# Patient Record
Sex: Male | Born: 1945 | Race: White | Hispanic: No | Marital: Single
Health system: Midwestern US, Academic
[De-identification: ages and names within clinical notes are randomized; demographics above are authoritative.]

## PROBLEM LIST (undated history)

## (undated) LAB — GXT EXERCISE (STRESS ONLY)
1 Minute Post HR: 141 {beats}/min
1.7 / 10% HR (Additional): 126 {beats}/min
1.7 / 10% HR: 115 {beats}/min
1.7 / 10% Time (Additional): 3
1.7 / 10% Time: 1
2 Minute Post HR: 123 {beats}/min
2.5 / 12% HR (Additional): 146 {beats}/min
2.5 / 12% HR: 133 {beats}/min
2.5 / 12% Time (Additional): 6
2.5 / 12% Time: 4
3 Minute Post HR: 112 {beats}/min
4 Minute Post HR: 108 {beats}/min
5 Minute Post HR: 111 {beats}/min
6 Minute Post HR: 109 {beats}/min
Angina Score During Excercise: 0
CALC Duke Treadmill Score: -4
Exercise Duration (min): 6 minutes
Exercise Time (Seconds): 22 Seconds
Immediate Stop HR: 151 {beats}/min
Max Grade: 14
Max Speed: 3.4
Pre-Exercise Physical Ausc: NORMAL
ST Seg Deviation: 2 mm
Standing HR: 105 {beats}/min
Supine HR: 93
Target Heart Rate: 130

---

## 2014-10-08 ENCOUNTER — Encounter

## 2014-10-15 ENCOUNTER — Inpatient Hospital Stay: Admit: 2014-10-15 | Payer: PRIVATE HEALTH INSURANCE

## 2014-10-15 DIAGNOSIS — I2510 Atherosclerotic heart disease of native coronary artery without angina pectoris: Secondary | ICD-10-CM

## 2014-10-15 NOTE — Unmapped (Signed)
Dr Peter Minium reviewed the EKG prior to starting stress test.  Dr Peter Minium reviewed EKGs prior to patient leaving. No chest pain but did have ST depression.  Erica at Dr Dellie Burns office notified of abnormal results. Explained that we can get the patient in to see Dr Richardine Service on Thursday and Dr Dellie Burns office agreed.  Spoke with patient and scheduled him to see Dr Richardine Service at 2:15pm on Thursday.

## 2014-10-17 ENCOUNTER — Ambulatory Visit: Admit: 2014-10-17 | Payer: PRIVATE HEALTH INSURANCE

## 2014-10-17 DIAGNOSIS — I208 Other forms of angina pectoris: Secondary | ICD-10-CM

## 2014-10-17 NOTE — Unmapped (Signed)
Schedule echocardiogram    Start aspirin 325mg  daily    Atorvastatin 20mg  each evening    Start Metoprolol 25mg  twice a day    Schedule heart cath     Outpatient Instructions for:  Left Heart Catheterization    1.  Your heart catheterization has been set up for:  TBD    2.  Arrival Time:  TBD  Diagnostic Center:  Same Day Surgery on 2nd floor of [facility]    The hospital will need to prepare you before the procedure.  This may take an hour or two before the procedure will start.  An IV line will be started.  You may need lab work drawn and an EKG done the day of the procedure.  Wear loose comfortable clothes.    Someone will need to drive you home from the hospital    3.  Diet:  Do not eat or drink after midnight    4.  You may take your morning medication with a sip of water.   If you are diabetic, take 1/2 of your regular morning insulin dosage. and DO NOT take the following medications 48 hours after the procedure:  Gluophage, Metformin, Glucovance, Avandamet, Combination    5.  Please notify the nurse if you are on COUMADIN / WARFARIN / PRADAXA / EFFIENT.  We may need to adjust your dose.  DO NOT STOP Plavix or Aspirin.    6.  Please notify the nurse if you have an allergy to contrast dye, IV dye, iodine or an allergic reaction to shellfish.    7.  Prior to the heart catheterization, the following tests will need to be obtained:  PT / INR    If the above test(s) are not completed with 2 weeks prior, the procedure maybe cancelled.        If you have any questions, please call your RN Nurse Coordinator.

## 2014-10-17 NOTE — Unmapped (Signed)
Subjective:      Patient ID: Kirk Hernandez is a 68 y.o. male     Chief Complaint: Abnml stress test    History of Present Illness:    10/17/2014 Initial Visit:    Kirk Hernandez is a 68 y.o. White or Caucasian male presenting to the clinic today for an abnml stress test. He has a hx of tinnitus and tried to get a hearing aid but was unable to. 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 Over 3.5 years he was told he no longer was considered diabetic. He denies kidney problems. He had a balloon angioplasty in 1999 in [place], [place] at the [facility]. He had an asymptomatic MI, other than pain on the right side of his face. He had similar sxs 2 weeks ago and thought it was an abscess tooth. He has an rx for nitro pills but has not had to use them. He denies taking ASA or cholesterol medicine. He has a hx of CAD. He has a family hx of heart dz. NKDA. He quit smoking in 1999. He drinks 10-15 beers qd. He has not exercised regularly in 2 years. He used to walk 5 miles every morning. When taking the stress test, he c/o SOB and leg heaviness.  He denies problems sleeping and lives by himself. He denies liver problems or blood clotting issues. Today, pt denies chest pain, palpitations, lightheadedness/dizziness, leg edema, or presyncope/syncope. Pt has no other complaints at this time.    The following portions of the patient's history were reviewed and updated as appropriate: allergies, current medications, past family history, past medical history, past social history, past surgical history and problem list.      Histories:    He has no past medical history on file.  He  has no past surgical history on file.  He family history is not on file.  He      ROS:      Reviewed and scanned into EMR/EPIC.     Allergies:      He has No Known Allergies.    Medications:      No outpatient prescriptions prior to visit.     No facility-administered medications prior to visit.       Objective:      BP 132/74  mmHg   Pulse 105   Ht 5' 10 (1.778 m)   Wt 235 lb (106.595 kg)   BMI 33.72 kg/m2   SpO2 94%  Wt Readings from Last 3 Encounters:   10/17/14 235 lb (106.595 kg)       Exceptional findings:         Physical Exam   Constitutional: No distress. He appears healthy.   HENT:  ?? Nose: Nose normal.   ?? Mouth/Throat: Dentition is normal.   ?? Eyes: Conjunctivae are normal. Pupils are equal, round, and reactive to light.   Neck: Normal range of motion. Neck supple. No JVD present.   Cardiovascular: Normal rate, regular rhythm and normal pulses.  Exam reveals no gallop and no friction rub.  No murmurs.  No carotid bruit  Pulmonary/Chest: Effort normal and breath sounds normal.   Abdominal: Soft. He  exhibits no distension.   Musculoskeletal: Normal range of motion. He  exhibits no edema.   Neurological: He  is alert and oriented to person, place, and time.   Skin: Skin is warm and dry.     Lab Review:     No results found for:  HGB, HCT, PLT, WBC, MCV, MCH  No results found for: NA, K, CL, CO2, BUN, CREATININE, GLUCOSE, PHOS, ALBUMIN, CALCIUM  No results found for: AST, ALT, ALBUMIN, ALKPHOS  No results found for: CHOLTOT, HDL, LDL, TRIG  No results found for: TSH  No results found for: HGBA1C, GLUCOSE, CREATININE    Prior Diagnostic Testing:    [facility] Stress: 10/15/14  ECG IMPRESSION   Abnormal exercise test   INDICATION:   FH, CAD  Exercise results:  Exercise time-minutes: 6 minutes Exercise time-sec: 22 seconds   Max speed: 3.4 mph Max grade: 14 %   Max HR: 151 Max BP: 150/82 mmHg   Reason for stopping: leg fatigue and shortness of breath Symptoms:   shortness of breath   ECG DATA  ??REST STRESS   Rhythm Sinus rhythm Sinus tachycardia   Conduction Normal Normal   Arrhythmias None VPCs ??At peak and recovery   Repolarization Normal and ST depression ST depression   ST Segment Change ??Strongly positive   ST Segment Location ??Inferolateral ??  ST Segment Configuration ??Horizontal   ST Segment Depression ??2   Duke Score: ?? -4 ?? (5 or  higher is Low Risk, -10 to +5 is Intermediate   Risk, and below -10 is High Risk)      Assessment:     Problem list:  1. Angina and abnormal stress test  1. CAD   2. History of PTCA  3. Borderline DM  4. Family Hx of CAD  5. Chronic EtOH use    Plan:       1. Echo  2. Add ASA 325 mg qd  3. Atrovastatin 20 mg qd  4. Metoprolol 25 mg po BID  5. Cardiac cath for definitive diagnosis    Follow up in 2 months.  Instructed patient to call office for worsening shortness of breath, chest pain, syncope, or any other concerning changes.     It is a pleasure to assist in the care of [facility].  Please call with any questions. All questions and concerns were addressed to the patient/family. Alternatives to my treatment were discussed. The note was completed using EMR. Every effort was made to ensure accuracy; however, inadvertent computerize transcription errors may be present.     This note was scribed in the presence of Collier Flowers, MD by Marcelyn Bruins Benschoten    The scribe???s documentation has been prepared under my direction and personally reviewed by me in its entirety. I confirm that the note above accurately reflects all work, treatment, procedures, and medical decision making performed by me.    Collier Flowers MD  [facility] Cardiology  Interventional Cardiology

## 2014-10-22 NOTE — Unmapped (Signed)
Patient f/u on meds ordered at [facility] on 12/31. Patient wants to know if he still needs to take them. They are still pending. Patient also f/u on scheduling procedure.

## 2014-10-23 MED ORDER — atenolol (TENORMIN) 25 MG tablet
25 | ORAL_TABLET | Freq: Every day | ORAL | 0.00 refills | 30.00000 days | Status: AC
Start: 2014-10-23 — End: 2014-10-28

## 2014-10-23 MED ORDER — aspirin 325 MG tablet
325 | ORAL_TABLET | Freq: Every day | ORAL | 1.00 refills | 30.00000 days | Status: AC
Start: 2014-10-23 — End: 2015-02-27

## 2014-10-23 MED ORDER — metoprolol tartrate (LOPRESSOR) 25 MG tablet
25 | ORAL_TABLET | Freq: Two times a day (BID) | ORAL | Status: AC
Start: 2014-10-23 — End: 2014-11-14

## 2014-10-23 NOTE — Unmapped (Signed)
Vmm left for patient to advise that prescriptions have been e scribed to [facility].  Procedure is scheduled for 1/11 at [facility] with a 6am arrival.  Contact number left for patient to return call to confirm receipt.

## 2014-10-28 ENCOUNTER — Ambulatory Visit: Admit: 2014-10-28 | Payer: PRIVATE HEALTH INSURANCE

## 2014-10-28 LAB — BASIC METABOLIC PANEL
Anion Gap: 12 mmol/L (ref 3–16)
BUN: 11 mg/dL (ref 7–25)
CO2: 23 mmol/L (ref 21–33)
Calcium: 9.1 mg/dL (ref 8.6–10.3)
Chloride: 105 mmol/L (ref 98–110)
Creatinine: 1.06 mg/dL (ref 0.60–1.30)
GFR MDRD Af Amer: 84 See note.
GFR MDRD Non Af Amer: 70 See note.
Glucose: 247 mg/dL — ABNORMAL HIGH (ref 70–100)
Osmolality, Calculated: 298 mosm/kg (ref 278–305)
Potassium: 3.5 mmol/L (ref 3.5–5.3)
Sodium: 140 mmol/L (ref 133–146)

## 2014-10-28 LAB — POC GLU MONITORING DEVICE: POC Glucose Monitoring Device: 197 mg/dL (ref 70–100)

## 2014-10-28 LAB — PROTIME-INR
INR: 1 (ref 0.9–1.1)
Protime: 12.5 s (ref 11.6–14.4)

## 2014-10-28 LAB — CBC
Hematocrit: 48 % (ref 38.5–50.0)
Hemoglobin: 16.3 g/dL (ref 13.2–17.1)
MCH: 30 pg (ref 27.0–33.0)
MCHC: 33.9 g/dL (ref 32.0–36.0)
MCV: 88.6 fL (ref 80.0–100.0)
MPV: 7.4 fL — ABNORMAL LOW (ref 7.5–11.5)
Platelets: 233 10E3/uL (ref 140–400)
RBC: 5.42 10E6/uL (ref 4.20–5.80)
RDW: 13.2 % (ref 11.0–15.0)
WBC: 9 10E3/uL (ref 3.8–10.8)

## 2014-10-28 LAB — HEMOGLOBIN A1C: Hemoglobin A1C: 7.6 % (ref 4.8–6.4)

## 2014-10-28 MED ORDER — fentaNYL (SUBLIMAZE) 50 mcg/mL injection
50 | INTRAMUSCULAR | Status: AC
Start: 2014-10-28 — End: 2014-10-28

## 2014-10-28 MED ORDER — sodium chloride 0.9 % infusion
INTRAVENOUS | Status: AC
Start: 2014-10-28 — End: 2014-10-28

## 2014-10-28 MED ORDER — heparin (porcine) 5,000 unit/mL injection
5000 | INTRAMUSCULAR | Status: AC
Start: 2014-10-28 — End: 2014-10-28

## 2014-10-28 MED ORDER — fentaNYL (SUBLIMAZE) injection
50 | INTRAMUSCULAR | Status: AC | PRN
Start: 2014-10-28 — End: 2014-10-28
  Administered 2014-10-28 (×2): 25 via INTRAVENOUS

## 2014-10-28 MED ORDER — OMNIPAQUE (iohexol) 350 mg iodine/mL
350 | INTRAVENOUS | Status: AC | PRN
Start: 2014-10-28 — End: 2014-10-28
  Administered 2014-10-28: 17:00:00 80

## 2014-10-28 MED ORDER — oxyCODONE-acetaminophen (PERCOCET) 5-325 mg per tablet 1 tablet
5-325 | ORAL | Status: AC | PRN
Start: 2014-10-28 — End: 2014-10-28

## 2014-10-28 MED ORDER — acetaminophen (TYLENOL) tablet 650 mg
325 | ORAL | Status: AC | PRN
Start: 2014-10-28 — End: 2014-10-28

## 2014-10-28 MED ORDER — verapamil (ISOPTIN) 2.5 mg/mL injection
2.5 | INTRAVENOUS | Status: AC
Start: 2014-10-28 — End: 2014-10-28

## 2014-10-28 MED ORDER — midazolam (PF) (VERSED) injection 1 mg
1 | Freq: Once | INTRAMUSCULAR | Status: AC
Start: 2014-10-28 — End: 2014-10-28

## 2014-10-28 MED ORDER — atorvastatin (LIPITOR) 40 MG tablet
40 | ORAL_TABLET | Freq: Every day | ORAL | Status: AC
Start: 2014-10-28 — End: ?

## 2014-10-28 MED ORDER — aspirin tablet 325 mg
325 | Freq: Once | ORAL | Status: AC
Start: 2014-10-28 — End: 2014-10-28

## 2014-10-28 MED ORDER — fentaNYL (SUBLIMAZE) injection 50 mcg
50 | Freq: Once | INTRAMUSCULAR | Status: AC | PRN
Start: 2014-10-28 — End: 2014-10-28

## 2014-10-28 MED ORDER — midazolam (PF) (VERSED) injection 1 mg
1 | Freq: Once | INTRAMUSCULAR | Status: AC | PRN
Start: 2014-10-28 — End: 2014-10-28

## 2014-10-28 MED ORDER — sodium chloride 0.9 % infusion 1,000 mL
INTRAVENOUS | Status: AC
Start: 2014-10-28 — End: 2014-10-28
  Administered 2014-10-28: 12:00:00 1000 mL via INTRAVENOUS

## 2014-10-28 MED ORDER — midazolam (PF) (VERSED) 1 mg/mL injection
1 | INTRAMUSCULAR | Status: AC
Start: 2014-10-28 — End: 2014-10-28

## 2014-10-28 MED ORDER — fentaNYL (SUBLIMAZE) injection 25 mcg
50 | Freq: Once | INTRAMUSCULAR | Status: AC
Start: 2014-10-28 — End: 2014-10-28
  Administered 2014-10-28: 16:00:00 25 ug via INTRAVENOUS

## 2014-10-28 MED ORDER — midazolam (PF) (VERSED) injection
1 | INTRAMUSCULAR | Status: AC | PRN
Start: 2014-10-28 — End: 2014-10-28
  Administered 2014-10-28: 16:00:00 1 via INTRAVENOUS
  Administered 2014-10-28: 16:00:00 0.5 via INTRAVENOUS

## 2014-10-28 MED FILL — VERAPAMIL 2.5 MG/ML INTRAVENOUS SOLUTION: 2.5 2.5 mg/mL | INTRAVENOUS | Qty: 2

## 2014-10-28 MED FILL — FENTANYL (PF) 50 MCG/ML INJECTION SOLUTION: 50 50 mcg/mL | INTRAMUSCULAR | Qty: 2

## 2014-10-28 MED FILL — HEPARIN (PORCINE) 5,000 UNIT/ML INJECTION SOLUTION: 5000 5,000 unit/mL | INTRAMUSCULAR | Qty: 2

## 2014-10-28 MED FILL — MIDAZOLAM (PF) 1 MG/ML INJECTION SOLUTION: 1 1 mg/mL | INTRAMUSCULAR | Qty: 5

## 2014-10-28 MED FILL — ASPIRIN 325 MG TABLET: 325 325 MG | ORAL | Qty: 1

## 2014-10-28 NOTE — Unmapped (Signed)
Echo completed.  R groin is soft, no hematoma, no oozing, dsx intact.  VS stable. Pt dressed and IV d/c'd.  Pt discharged from [facility] via wheelchair escort. All belongings with patient.

## 2014-10-28 NOTE — Unmapped (Signed)
MD paged about pt's FSBS. Pt's glucose from labs this am-247 and post cath 197. Pt states he has not been taking any diabetic meds x 2 years d/t his MD told him he was not diabetic anymore.  Also, pt's cardiology meds are supposed to be changed per Cath lab RN Maddie.  Waiting for MD to call back.

## 2014-10-28 NOTE — Unmapped (Signed)
ATTENDING: Collier Flowers, MD  FELLOW: Gerre Scull, MD    PROCEDURES PERFORMED:  1. Left heart catheterization  2. Selective coronary angiography  3. Left ventriculogram  4. Deployment of angioseal     INDICATIONS:   Positive stress test     DETAILS OF PROCEDURE:    Procedure, it risks, benefits and alternatives were explained to the patient, all questions were answered and informed consent was obtained.    * The patient was brought to [facility] and prepped and draped in the usual sterile fashion for intended access to the  right femoral artery.     * Lidocaine 2% was injected subcutaneously to achieve local anesthesia.  * The  right was accessed via modified Seldinger technique without difficulty, and a 5 French  arterial sheath was advanced over a wire into the artery, the wire removed,  and the sheath aspirated and flushed.      * The sideport of the arterial sheath was hooked directly to the manifold for injection of the right femoral artery.  This showed the access site to be above the level of the femoral arterial bifurcation with no evidence of vascular complication.    * A JL 4 catheter was advanced over a wire, visualized under fluoroscopy, and used to selectively engage the left coronary ostia.  Images of the left coronary tree were obtained.      * The JL4 catheter was then removed and exchanged over a wire for a JR 4 catheter. This was used to selectively engage the right coronary ostia.  Images of the  right coronary tree were obtained, and the JR 4 catheter was then removed in its entirety.  The sheath was again aspirated and flushed without difficulty.      * A pigtail catheter was advanced over a wire, visualized under fluoroscopy, and used to cross the aortic valve and enter the left ventricular cavity.      * Left ventricular end diastolic pressure was measured.      * A left ventriculogram was performed with a power injector.     * The pigtail catheter  was pulled back across the aortic valve, demonstrating no gradient.  The pigtail catheter was then removed in its entirety.      * The patient was stable throughout the procedure and at the end of procedure, no complication noted.       PROCEDURAL FINDINGS:  1. Left main coronary artery was heavily calcified and diffusely diseased up to 40%. It gave of the left anterior descending artery, ramus intermidius and the left circumflex artery.      2. The left anterior descending had a normal anatomic course.  It gave off diagonals. Proximal to mid LAD was diffusely diseased and heavily calcified, long sequential 60-70% stenosis noted in proximal LAD. First D1 branch was medium size vessel and mid D1 had 60% stenosis.     3. The left circumflex artery was small, non-dominant vessel and it gives off obtuse marginals and it was diffusely diseased.     4. RI was diffusely diseased with stenosis up to 40%.     5. The right coronary artery was a large, dominant vessel and gave rise to posterior descending artery and posterolateral branches distally. RCA wad diffusely diseased up to 40% and and heavily calcified.    HEMODYNAMICS:  LVEDP:  LVEF: 50-55%  No gradient across the aortic valve      CONCLUSION:  1. Moderate  Diffuse calcified CAD in LAD/ diagonal     PLAN:  1. Add high intensity statin, continue ASA for secondary prevention.   2. Echocardiogram prior to discharge.  3. Follow up with PCP ASAP for better DM management.   4. Follow up with Dr. Richardine Service as outpatient in next 2-3 weeks, will reassess his angina after medication optimization. May consider a stress MPI to assess medical therapy is working or he may need CABG.    Dr. Richardine Service was present during all critical and key portions of the procedure and was available to furnish services during the entire procedure.    Gerre Scull, MD  Cardiology fellow     I have seen, examined and discussed with fellow. Agree with above plan.    Collier Flowers MD  [facility]  Cardiology  Interventional Cardiology    10/28/2014  1:19 PM    CC:  Joneen Caraway, MD  Collier Flowers, MD  @REFER @

## 2014-10-28 NOTE — Unmapped (Signed)
Pt brought from cath lab post LHC procedure. Bedside report received from [facility]. R femoral artery used for access.  R femoral artery angiosealed in cath lab at 1145. R is soft, no hematoma, no oozing noted. Tegaderm dsx intact. Pt's bedrest x 2 hours, pt aware of their restrictions to their RLE and pt aware of his bedrest restrictions.  Pt's FSBS 197- pt given a snack and lunch will be ordered. Will continue to monitor pt closely.

## 2014-10-28 NOTE — Unmapped (Addendum)
Pt off of bedrest. Pt up to restroom, pt ambulated well. R groin is soft, no hematoma, no oozing, dsx intact.  VS stable. MD ordered an A1C lab- added on to pt's blood drawn this am.    Pt given discharge instructions, angioseal booklet, pt educated on the change in his medications and pt given instructions to follow up with his PCP ASAP about his blood glucose and A1C results.  Pt gave verbal understanding to d/c instructions and education. Pt's friend is driving him home.   MD ordered for pt to get an Echo before he is discharged. Echo called, they will be to [facility] ASAP to perform Echo.

## 2014-10-28 NOTE — Unmapped (Signed)
Echo at bedside

## 2014-10-28 NOTE — Unmapped (Signed)
Care for the Catheter Insertion Site      Refer to this sheet over the next several weeks. These instructions provide you with information on caring for yourself after your procedure. Your caregiver may also give you additional specific instructions. Your treatment has been planned according to current evidence-based medical practices, but problems sometimes occur. Call your care provider if you have any concerns or questions after your procedure.     HOME CARE INSTRUCTIONS  ??? You may shower 24 hours after the procedure. Remove the bandage (dressing) and gently wash the site with plain soap and water.   ??? Keep the area clean and dry. Do not apply powder or lotion to the site.  ??? Do not sit in a bathtub, swimming pool, or whirlpool for 7 days.   For the first 7 days do not participate in strenuous activity, climb stairs slowly, and do not lift over 10 pounds. Do not strain during bowel movements.Inspect the site at least twice daily. Watch groin for bleeding, swelling, redness, and drainage. Signs of infection include redness or streaking, swelling, and discharge from the site.Do not drive home if you are discharged the same day of the procedure. Have someone else drive you. You may drive 24 hours after the procedure unless otherwise instructed by your caregiver.   ??? A responsible adult should be with you for the first 24 hours after you arrive home.      IF Bleeding Occurs:  ??? Small amount of bleeding or oozing at groin site:  Sit or lie down and apply firm pressure for 10 minutes.  When bleeding stops, sit or lie still and keep leg straight for 2 hours.  Call your doctor for further instructions. Cardiology office # [phone]  ??? If there is heavy bleeding, spurting or bleeding does not stop after 10 minutes:  Call 911, lie down and hold firm pressure to the site until help arrives  DO NOT DRIVE or get into a private car.    When to call your doctor:  ??? Chest pain or shortness of breath.  ??? Temperature higher  than 101??  ??? Pain in thigh or leg.  ??? Change in color, movement or sensation of affected area.  ??? Change in groin (bleeding, swelling, drainage, pain)    Seek Immediate Medical Care If:  ??? You have unusual pain at the groin site or down the affected leg.   ??? You have redness, warmth, swelling, or pain at the groin site.   ??? You have drainage (other than a small amount of blood on the dressing).   ??? You have chills.   ??? You have a fever or persistent symptoms for more than 72 hours.  ???

## 2014-10-28 NOTE — Unmapped (Signed)
Cath lab called- Dr. Richardine Service still working on his first patient.  Pt told this, pt stated- if he does not go within the next 30-45 minutes he is leaving.   Cath lab told this information.

## 2014-10-28 NOTE — Unmapped (Signed)
Cath lab called at 0930- Dr. Richardine Service still working on his first patient.   Patient updated with this information, pt okay with this,   pt offered a warm blanket, something to read.

## 2014-10-28 NOTE — Unmapped (Signed)
Pt states he is not diabetic.  Glucose from labs today- 247- MD aware.  Pt has hx of ETOH abuse. No signs or symptoms today that pt has ETOH in his system and pt denies any use in theNo blood level needs to be checked per Deidra RN in cath lab.

## 2014-10-28 NOTE — Unmapped (Signed)
Dr. Selena Batten returned call.     Will place post procedure orders in 5 minutes.

## 2014-10-28 NOTE — Unmapped (Signed)
Pre procedural Sedation Assessment Note    Pre- procedure Diagnosis: positive stress test     Planned procedure:  LHC +- PCI    H&P reviewed    Allergy:  No Known Allergies    Problem list:  There is no problem list on file for this patient.     PMH:  History reviewed. No pertinent past medical history.  Outpatient Meds:  Prescriptions prior to admission   Medication Sig Dispense Refill Last Dose   ??? aspirin 325 MG tablet Take 1 tablet (325 mg total) by mouth daily. 30 tablet 3 10/28/2014 at Unknown time   ??? atenolol (TENORMIN) 25 MG tablet Take 1 tablet (25 mg total) by mouth daily. 30 tablet 3 10/28/2014 at Unknown time   ??? metoprolol tartrate (LOPRESSOR) 25 MG tablet Take 1 tablet (25 mg total) by mouth 2 times a day. 60 tablet 3 10/28/2014 at Unknown time       ASA Classification:  ASA 2 - Patient with mild systemic disease with no functional limitations    Airway Assessment:  within normal limits    Mallampati Classification:  II (hard and soft palate, upper portion of tonsils anduvula visible)    Physical Examination   Filed Vitals:    10/28/14 0704   BP: 154/83   Pulse: 78   Temp:    Resp: 16   SpO2: 96%     General appearance - AAOX3  Chest - clear to auscultation  Heart - Regular heart sounds  Neck - No JVD    Labs:      Lab 10/28/14  0640   WBC 9.0   HEMOGLOBIN, POC 16.3   HEMATOCRIT BLOOD GAS 48.0   PLATELETS, POC 233   MCV, POC 88.6   INR POC 1.0         Lab 10/28/14  0640   SODIUM 140   POTASSIUM 3.5   CHLORIDE 105   CARBON DIOXIDE (CO2) 23   BUN 11   CREATININE 1.06   GLUCOSE 247*   CALCIUM 9.1     CrCl cannot be calculated (Unknown ideal weight.).      No results for input(s): HDL, LDLCALC, TRIG in the last 72 hours.    Invalid input(s): CHOL]        Invalid input(s): ALP, ALB    Planned anesthetic agent - Versed and Fentanyl    Sedation plan, risks, benefits. Potential complications and any alternative options associated with procedure and possible use of blood discussed with patient. Consent  obtained.    Plan:  Proceed with procedure    Gerre Scull, MD  Cardiology fellow   10/28/2014  11:01 AM

## 2014-10-28 NOTE — Unmapped (Signed)
Pt updated that Dr. Richardine Service is working on a pt in cath lab and his procedure will be delayed 1-2 hours.  Pt okay with this.

## 2014-10-29 NOTE — Unmapped (Signed)
Patient state he was told by Dr. Richardine Service to have another stress test in 3 weeks need order put in Epic, so he can schedule.

## 2014-10-31 ENCOUNTER — Encounter

## 2014-10-31 NOTE — Unmapped (Signed)
You have been scheduled for a Nuclear Perfusion Stress test at the [facility] on     at     .  The Nuclear Stress test shows your physician how well your heart works while you exercise.  This test allow yours physician to evaluate blood flow through your heart.  The procedure takes 2-4 hours to complete.    The Nurse/Technician from [facility] will escort you back to a room in the lab.  They will obtain a brief history from you and start an IV.  The following is a description of the Nuclear Perfusion Stress test.  The order of events may be altered depending on your risk factors, previous history of CAD or body density.  Rest pictures will be obtained an hour after you have been injected with a small amount of nuclear imaging agent.  This is not a dye, and you will not have an allergic reaction to this.  To obtain these pictures you will lie on a narrow table with your hands above your head.  These pictures take about 25 minutes.  For the stress portion of the test you will be hooked up to the 12 lead EKG so your heart can be monitored at all times.  Throughout the test your blood pressure will also be monitored.  You will be stressed by either walking on a treadmill; or receiving medicine through your IV line.  Following the stress portion you will have a break of 30-45 minutes.  At this time, you may have something to eat or drink.  You will then return to the [facility] lab for your final set of pictures.  You will be under the camera for about 15 to 20 minutes.  The Cardiologist may use one or both sets of pictures in determining how well your heart is working.  When you are finished with your final set of pictures you are free to leave.  The entire procedure will take 2 to 3 hours to complete.    Your physician will contact you with the results of your test.    Please arrive at [facility] - [address], [place], [place] [address] 15 minutes prior to procedure.  Check in at the front desk in main lobby.  They  will direct you to the outpatient testing area.    PATIENT INSTRUCTIONS:    Do not eat 3 hours prior to the test (if you are diabetic, please eat a light meal and take your insulin/oral agent as prescribed).    Wear or bring comfortable shoes and clothes.    Hold Beta Blockers for 48 hours and Theophylline for 72 hours before test unless you are instructed by your physician not to hold them.  If you are unsure if you are on Beta Blockers or whether you should hold the Beta Blockers, please call your physicians office.  Hold Calcium Channel Blockers for 24 hours.    You may take regularly prescribed medication with water unless instructed by your physician to do otherwise.  We ask that you refrain from using laxatives right before your appointment or taking diuretic/water pill right before the appointment.  Please take these several hours earlier or right after the test, as these medications may make it impossible for you to lie under the camera for long periods of time.    DO NOT EAT/DRINK anything with CAFFEINE, DECAFFEINATED PRODUCTS or ANY PRODUCTS CONTAINING CHOCOLATE for 24 hours.  If you do not refrain from these 3   things for 24 hours, you may need to be rescheduled.    If you use inhalers to help your breathing, please bring these with you.

## 2014-11-01 NOTE — Unmapped (Signed)
Lab results and cath results routed to Dr. Karma Ganja via [facility] with note recommending aggressive management of CAD and DM.

## 2014-11-01 NOTE — Unmapped (Signed)
VMM left for patient notifying him that the stress test order has been placed in the EPIC.  Instructed to follow up with PCP on diabetes management.  Advised to record bp daily and take all medications as prescribed to managed CAD.

## 2014-11-11 MED ORDER — atorvastatin (LIPITOR) 20 MG tablet
20 | ORAL_TABLET | Freq: Every day | ORAL | Status: AC
Start: 2014-11-11 — End: 2015-03-02

## 2014-11-11 MED ORDER — aspirin 325 MG tablet
325 | ORAL_TABLET | Freq: Every day | ORAL | 1.00 refills | 30.00000 days | Status: AC
Start: 2014-11-11 — End: 2015-02-27

## 2014-11-11 MED ORDER — metoprolol tartrate (LOPRESSOR) 25 MG tablet
25 | ORAL_TABLET | Freq: Two times a day (BID) | ORAL | Status: AC
Start: 2014-11-11 — End: 2014-11-14

## 2014-11-14 ENCOUNTER — Ambulatory Visit: Admit: 2014-11-14 | Discharge: 2014-11-14 | Payer: PRIVATE HEALTH INSURANCE

## 2014-11-14 DIAGNOSIS — I25119 Atherosclerotic heart disease of native coronary artery with unspecified angina pectoris: Secondary | ICD-10-CM

## 2014-11-14 MED ORDER — isosorbide mononitrate (IMDUR) 30 MG 24 hr tablet
30 | ORAL_TABLET | Freq: Every day | ORAL | Status: AC
Start: 2014-11-14 — End: 2014-12-11

## 2014-11-14 MED ORDER — metoprolol tartrate (LOPRESSOR) 25 MG tablet
25 | ORAL_TABLET | Freq: Two times a day (BID) | ORAL | Status: AC
Start: 2014-11-14 — End: 2015-03-02

## 2014-11-14 NOTE — Unmapped (Signed)
New medication: isosorbide mononitrate (IMDUR) 30 MG 24 hr tablet, once daily   New dose: Metoprolol (Lopressor) 50 mg twice daily   Have NM stress test completed--[facility]--February 11th @ 8:30 AM    You have been scheduled for a Nuclear Perfusion Stress test at the [facility] on     at     .  The Nuclear Stress test shows your physician how well your heart works while you exercise.  This test allow yours physician to evaluate blood flow through your heart.  The procedure takes 2-4 hours to complete.    The Nurse/Technician from [facility] will escort you back to a room in the lab.  They will obtain a brief history from you and start an IV.  The following is a description of the Nuclear Perfusion Stress test.  The order of events may be altered depending on your risk factors, previous history of CAD or body density.  Rest pictures will be obtained an hour after you have been injected with a small amount of nuclear imaging agent.  This is not a dye, and you will not have an allergic reaction to this.  To obtain these pictures you will lie on a narrow table with your hands above your head.  These pictures take about 25 minutes.  For the stress portion of the test you will be hooked up to the 12 lead EKG so your heart can be monitored at all times.  Throughout the test your blood pressure will also be monitored.  You will be stressed by either walking on a treadmill; or receiving medicine through your IV line.  Following the stress portion you will have a break of 30-45 minutes.  At this time, you may have something to eat or drink.  You will then return to the [facility] lab for your final set of pictures.  You will be under the camera for about 15 to 20 minutes.  The Cardiologist may use one or both sets of pictures in determining how well your heart is working.  When you are finished with your final set of pictures you are free to leave.  The entire procedure will take 2 to 3 hours to  complete.    Your physician will contact you with the results of your test.    Please arrive at [facility] - [address], [place], [place] [address] 15 minutes prior to procedure.  Check in at the front desk in main lobby.  They will direct you to the outpatient testing area.    PATIENT INSTRUCTIONS:    Do not eat 3 hours prior to the test (if you are diabetic, please eat a light meal and take your insulin/oral agent as prescribed).    Wear or bring comfortable shoes and clothes.    Hold Beta Blockers for 48 hours and Theophylline for 72 hours before test unless you are instructed by your physician not to hold them.  If you are unsure if you are on Beta Blockers or whether you should hold the Beta Blockers, please call your physicians office.  Hold Calcium Channel Blockers for 24 hours.    You may take regularly prescribed medication with water unless instructed by your physician to do otherwise.  We ask that you refrain from using laxatives right before your appointment or taking diuretic/water pill right before the appointment.  Please take these several hours earlier or right after the test, as these medications may make it impossible for you  to lie under the camera for long periods of time.    DO NOT EAT/DRINK anything with CAFFEINE, DECAFFEINATED PRODUCTS or ANY PRODUCTS CONTAINING CHOCOLATE for 24 hours.  If you do not refrain from these 3 things for 24 hours, you may need to be rescheduled.    If you use inhalers to help your breathing, please bring these with you.

## 2014-11-14 NOTE — Unmapped (Signed)
Subjective:      Patient ID: Kirk Hernandez is a 69 y.o. male     Chief Complaint:  F/u CAD    History of Present Illness:    10/17/2014 Initial Visit:    Kirk Hernandez is a 69 y.o. White or Caucasian male presenting to the clinic today for an abnml stress test. He has a hx of tinnitus and tried to get a hearing aid but was unable to. 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 Over 3.5 years he was told he no longer was considered diabetic. He denies kidney problems. He had a balloon angioplasty in 1999 in [place], [place] at the [facility]. He had an asymptomatic MI, other than pain on the right side of his face. He had similar sxs 2 weeks ago and thought it was an abscess tooth. He has an rx for nitro pills but has not had to use them. He denies taking ASA or cholesterol medicine. He has a hx of CAD. He has a family hx of heart dz. NKDA. He quit smoking in 1999. He drinks 10-15 beers qd. He has not exercised regularly in 2 years. He used to walk 5 miles every morning. When taking the stress test, he c/o SOB and leg heaviness.  He denies problems sleeping and lives by himself. He denies liver problems or blood clotting issues. Today, pt denies chest pain, palpitations, lightheadedness/dizziness, leg edema, or presyncope/syncope. Pt has no other complaints at this time.    Interval History: 11/14/14    Kirk Hernandez is a 69 y.o. White or Caucasian male presenting to the clinic today for a follow up after his cath procedure. He was overall happy with his procedure and service. He c/o fatigue in the afternoon but states that he is an early riser. He notes some chest pain. He is very active and does some walking. Denies any problems walking. He joined the rec center at [employer].     Denies palpitations, lightheadedness/dizziness, shortness of breath, leg edema, or presyncope/syncope.    No other concerns or complaints at this time.     The following portions of the patient's history were  reviewed and updated as appropriate: allergies, current medications, past family history, past medical history, past social history, past surgical history and problem list.      Histories:    He has no past medical history on file.  He  has no past surgical history on file.  He family history is not on file.  He  reports that he has never smoked. He does not have any smokeless tobacco history on file. He reports that he drinks about 7.2 oz of alcohol per week.    ROS:      Reviewed and scanned into EMR/EPIC.     Allergies:      He has No Known Allergies.    Medications:      Outpatient Prescriptions Prior to Visit   Medication Sig Dispense Refill   ??? aspirin 325 MG tablet Take 1 tablet (325 mg total) by mouth daily. 30 tablet 3   ??? aspirin 325 MG tablet Take 1 tablet (325 mg total) by mouth daily. 30 tablet 3   ??? atorvastatin (LIPITOR) 20 MG tablet Take 1 tablet (20 mg total) by mouth daily. 30 tablet 3   ??? atorvastatin (LIPITOR) 40 MG tablet Take 1 tablet (40 mg total) by mouth daily. 30 tablet 3   ??? metoprolol tartrate (LOPRESSOR) 25 MG tablet  Take 1 tablet (25 mg total) by mouth 2 times a day. 60 tablet 3   ??? metoprolol tartrate (LOPRESSOR) 25 MG tablet Take 1 tablet (25 mg total) by mouth 2 times a day. 60 tablet 3     No facility-administered medications prior to visit.       Objective:      BP 120/68 mmHg   Pulse 101   Ht 5' 10 (1.778 m)   Wt 236 lb (107.049 kg)   BMI 33.86 kg/m2   SpO2 95%  Wt Readings from Last 3 Encounters:   10/17/14 235 lb (106.595 kg)       Exceptional findings:         Physical Exam   Constitutional: No distress. He appears healthy.   HENT:  ?? Nose: Nose normal.   ?? Mouth/Throat: Dentition is normal.   ?? Eyes: Conjunctivae are normal. Pupils are equal, round, and reactive to light.   Neck: Normal range of motion. Neck supple. No JVD present.   Cardiovascular: Normal rate, regular rhythm and normal pulses.  Exam reveals no gallop and no friction rub.  No murmurs.  No carotid  bruit  Pulmonary/Chest: Effort normal and breath sounds normal.   Abdominal: Soft. He  exhibits no distension.   Musculoskeletal: Normal range of motion. He  exhibits no edema.   Neurological: He  is alert and oriented to person, place, and time.   Skin: Skin is warm and dry.     Lab Review:     Lab Results   Component Value Date    HGB 16.3 10/28/2014    HCT 48.0 10/28/2014    PLT 233 10/28/2014    WBC 9.0 10/28/2014    MCV 88.6 10/28/2014    MCH 30.0 10/28/2014     Lab Results   Component Value Date    NA 140 10/28/2014    K 3.5 10/28/2014    CL 105 10/28/2014    CO2 23 10/28/2014    BUN 11 10/28/2014    CREATININE 1.06 10/28/2014    GLUCOSE 247* 10/28/2014    CALCIUM 9.1 10/28/2014     No results found for: AST, ALT, ALBUMIN, ALKPHOS  No results found for: CHOLTOT, HDL, LDL, TRIG  No results found for: TSH  Lab Results   Component Value Date    HGBA1C 7.6* 10/28/2014    GLUCOSE 247* 10/28/2014    CREATININE 1.06 10/28/2014       Prior Diagnostic Testing:    Cardiac Cath: 10/30/14  PROCEDURAL FINDINGS:  1. Left main coronary artery was heavily calcified and diffusely diseased up to 40%. It gave of the left anterior descending artery, ramus intermidius and the left circumflex artery.   2. The left anterior descending had a normal anatomic course.?? It gave off diagonals. Proximal to mid LAD was diffusely diseased and heavily calcified, long sequential 60-70% stenosis noted in proximal LAD. First D1 branch was medium size vessel and mid D1 had 60% stenosis.   3. The left circumflex artery was small, non-dominant vessel and it gives off obtuse marginals and it was diffusely diseased.   4. RI was diffusely diseased with stenosis up to 40%.   5. The right coronary artery was a large, dominant vessel and gave rise to posterior descending artery and posterolateral branches distally. RCA wad diffusely diseased up to 40% and and heavily calcified.    Echocardiogram: 10/28/14  Study Conclusions  - Left ventricle: The cavity  size was normal. Wall thickness was  ????  normal. Systolic function was normal. The estimated ejection  ????fraction was in the range of 50% to 55%. Wall motion was normal;  ????there were no regional wall motion abnormalities. Left ventricular  ????diastolic function parameters were normal.  - Right ventricle: Systolic function was mildly reduced by objective  ????interpretation. TAPSE: 1.7cm.Tricuspid annular systolic velocity:  ????11cm/s.  - Pulmonary arteries: Systolic pressure was within the normal range,  ????in the range of 25mm Hg to 30mm Hg assuming that the right atrial  ????pressure was 0-5 mmHg.    [facility] Stress: 10/15/14  ECG IMPRESSION   Abnormal exercise test   INDICATION:   FH, CAD  Exercise results:  Exercise time-minutes: 6 minutes Exercise time-sec: 22 seconds   Max speed: 3.4 mph Max grade: 14 %   Max HR: 151 Max BP: 150/82 mmHg   Reason for stopping: leg fatigue and shortness of breath Symptoms:   shortness of breath   ECG DATA  ??REST STRESS   Rhythm Sinus rhythm Sinus tachycardia   Conduction Normal Normal   Arrhythmias None VPCs ??At peak and recovery   Repolarization Normal and ST depression ST depression   ST Segment Change ??Strongly positive   ST Segment Location ??Inferolateral ??  ST Segment Configuration ??Horizontal   ST Segment Depression ??2   Duke Score: ?? -4 ?? (5 or higher is Low Risk, -10 to +5 is Intermediate   Risk, and below -10 is High Risk)      Assessment:     Problem list:    1. Angina  2. CAD s/p left heart cath with diffuse calcified plaque in left main coronary artery as well as prox to mid LAD  3. History of PTCA  4. Borderline DM  5. Family Hx of CAD  6. Chronic EtOH use    Plan:       1. Maximize medical therapy for angina control by adding Imdur 30 mg qd and increase beta blocker dose   2. Continue ASA 325 mg qd  3. Atrovastatin 20 mg qd  4. Metoprolol 25 mg po BID  5. Record BP at home  6. Would like to do an exercise with perfusion imaging to assess ischemia burden or TID on good medical  therapy. If he has significant ischemia, then CABG opinion referral may be made.    Follow up in 1-2 months.  Instructed patient to call office for worsening shortness of breath, chest pain, syncope, or any other concerning changes.     It is a pleasure to assist in the care of [facility].  Please call with any questions. All questions and concerns were addressed to the patient/family. Alternatives to my treatment were discussed. The note was completed using EMR. Every effort was made to ensure accuracy; however, inadvertent computerize transcription errors may be present.     This note was scribed in the presence of Collier Flowers, MD by Marcelyn Bruins Benschoten    The scribe???s documentation has been prepared under my direction and personally reviewed by me in its entirety. I confirm that the note above accurately reflects all work, treatment, procedures, and medical decision making performed by me.    Collier Flowers MD  [facility]  Interventional Cardiology

## 2014-11-15 NOTE — Unmapped (Signed)
Pharmacy stating that they need clarification on the Metoprolol.

## 2014-11-15 NOTE — Unmapped (Signed)
Clarified to the pharmacist the patient is to take 50 mg Metoprolol by mouth BID.

## 2014-11-28 ENCOUNTER — Inpatient Hospital Stay: Admit: 2014-11-28 | Payer: PRIVATE HEALTH INSURANCE

## 2014-11-28 DIAGNOSIS — R079 Chest pain, unspecified: Secondary | ICD-10-CM

## 2014-11-28 MED ORDER — regadenoson (LEXISCAN) 0.4 mg/5 mL syringe 0.4 mg
0.4 | Freq: Once | INTRAVENOUS | Status: AC
Start: 2014-11-28 — End: 2014-11-28
  Administered 2014-11-28: 16:00:00 0.4 mg via INTRAVENOUS

## 2014-11-28 MED ORDER — Tc-99m technetium tetrofosmin (MYOVIEW) 40 milli Curie
Freq: Once | INTRAVENOUS | Status: AC | PRN
Start: 2014-11-28 — End: 2014-11-28
  Administered 2014-11-28: 15:00:00 40 via INTRAVENOUS

## 2014-11-28 MED ORDER — sodium chloride 0.9 % flush 5 mL
Freq: Once | INTRAMUSCULAR | Status: AC
Start: 2014-11-28 — End: 2014-11-28
  Administered 2014-11-28: 16:00:00 5 mL via INTRAVENOUS

## 2014-11-28 MED ORDER — aminophylline 250 mg/10 mL injection 50-100 mg
250 | Freq: Once | INTRAVENOUS | Status: AC | PRN
Start: 2014-11-28 — End: 2014-11-28

## 2014-11-28 MED ORDER — Tc-99m technetium tetrofosmin (MYOVIEW) 12.6 milli Curie
Freq: Once | INTRAVENOUS | Status: AC | PRN
Start: 2014-11-28 — End: 2014-11-28
  Administered 2014-11-28: 13:00:00 12.6 via INTRAVENOUS

## 2014-11-28 MED FILL — LEXISCAN 0.4 MG/5 ML INTRAVENOUS SYRINGE: 0.4 0.4 mg/5 mL | INTRAVENOUS | Qty: 5

## 2014-12-02 NOTE — Unmapped (Signed)
Patient advised of results of stress test, need to modify risk factors and continue with aggressive health management.  Patient verbalized understanding.  Message sent to scheduler to have March appt moved to May.

## 2014-12-11 MED ORDER — isosorbide mononitrate (IMDUR) 30 MG 24 hr tablet
30 | ORAL_TABLET | ORAL | Status: AC
Start: 2014-12-11 — End: 2015-01-06

## 2015-01-06 MED ORDER — isosorbide mononitrate (IMDUR) 30 MG 24 hr tablet
30 | ORAL_TABLET | ORAL | Status: AC
Start: 2015-01-06 — End: 2015-05-12

## 2015-02-27 ENCOUNTER — Ambulatory Visit: Admit: 2015-02-27 | Discharge: 2015-02-27 | Payer: PRIVATE HEALTH INSURANCE

## 2015-02-27 DIAGNOSIS — I25118 Atherosclerotic heart disease of native coronary artery with other forms of angina pectoris: Secondary | ICD-10-CM

## 2015-02-27 MED ORDER — aspirin 81 MG chewable tablet
81 | ORAL_TABLET | Freq: Every day | ORAL | 1.00 refills | 30.00000 days | Status: AC
Start: 2015-02-27 — End: ?

## 2015-02-27 NOTE — Unmapped (Signed)
Subjective:      Patient ID: Kirk Hernandez is a 69 y.o. male     Chief Complaint:  Follow-up    History of Present Illness:    Kirk Hernandez is a 69 y.o. White or Caucasian male presenting to the clinic today for an abnml stress test. He has a hx of tinnitus and tried to get a hearing aid but was unable to. 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 Over 3.5 years he was told he no longer was considered diabetic. He denies kidney problems. He had a balloon angioplasty in 1999 in [place], [place] at the [facility]. He had an asymptomatic MI, other than pain on the right side of his face. He had similar sxs 2 weeks ago and thought it was an abscess tooth. He has an rx for nitro pills but has not had to use them. He denies taking ASA or cholesterol medicine. He has a hx of CAD. He has a family hx of heart dz. NKDA. He quit smoking in 1999. He drinks 10-15 beers qd. He has not exercised regularly in 2 years. He used to walk 5 miles every morning. When taking the stress test, he c/o SOB and leg heaviness.  He denies problems sleeping and lives by himself. He denies liver problems or blood clotting issues. Today, pt denies chest pain, palpitations, lightheadedness/dizziness, leg edema, or presyncope/syncope. Pt has no other complaints at this time.    Interval History: 11/14/14    [name] is a 69 y.o. White or Caucasian male presenting to the clinic today for a follow up after his cath procedure. He was overall happy with his procedure and service. He c/o fatigue in the afternoon but states that he is an early riser. He notes some chest pain. He is very active and does some walking. Denies any problems walking. He joined the rec center at [employer].     Denies palpitations, lightheadedness/dizziness, shortness of breath, leg edema, or presyncope/syncope.    No other concerns or complaints at this time.     The following portions of the patient's history were reviewed and updated as  appropriate: allergies, current medications, past family history, past medical history, past social history, past surgical history and problem list.      02/27/2015 Interval visit   [name] is a 69 y.o. White or Caucasian male. Since his last visit he has been doing well. He lost 12 lbs with good diet and exercise regimen.         Today, pt denies chest pain, palpitations, lightheadedness/dizziness, shortness of breath, leg edema, or presyncope/syncope.  Histories:    He has no past medical history on file.  He  has no past surgical history on file.  He family history is not on file.  He  reports that he has never smoked. He does not have any smokeless tobacco history on file. He reports that he drinks about 7.2 oz of alcohol per week.    ROS:      Reviewed and scanned into EMR/EPIC.     Allergies:      He has No Known Allergies.    Medications:      Outpatient Prescriptions Prior to Visit   Medication Sig Dispense Refill   ??? aspirin 325 MG tablet Take 1 tablet (325 mg total) by mouth daily. 30 tablet 3   ??? atorvastatin (LIPITOR) 40 MG tablet Take 1 tablet (40 mg total) by mouth daily. 30  tablet 3   ??? isosorbide mononitrate (IMDUR) 30 MG 24 hr tablet TAKE 1 TABLET (30 MG TOTAL) BY MOUTH DAILY. 30 tablet 2   ??? metoprolol tartrate (LOPRESSOR) 25 MG tablet Take 2 tablets (50 mg total) by mouth 2 times a day. 60 tablet 3   ??? aspirin 325 MG tablet Take 1 tablet (325 mg total) by mouth daily. 30 tablet 3   ??? atorvastatin (LIPITOR) 20 MG tablet Take 1 tablet (20 mg total) by mouth daily. 30 tablet 3     No facility-administered medications prior to visit.       Objective:      BP 118/60 mmHg   Pulse 80   Ht 5' 10 (1.778 m)   Wt 231 lb 8 oz (105.008 kg)   BMI 33.22 kg/m2   SpO2 97%  Wt Readings from Last 3 Encounters:   02/27/15 231 lb 8 oz (105.008 kg)   11/14/14 236 lb (107.049 kg)   10/17/14 235 lb (106.595 kg)       Exceptional findings:  none       Physical Exam   Constitutional: No distress. He appears  healthy.   HENT:  ?? Nose: Nose normal.   ?? Mouth/Throat: Dentition is normal.   ?? Eyes: Conjunctivae are normal. Pupils are equal, round, and reactive to light.   Neck: Normal range of motion. Neck supple. No JVD present.   Cardiovascular: Normal rate, regular rhythm and normal pulses.  Exam reveals no gallop and no friction rub.  No murmurs.  No carotid bruit  Pulmonary/Chest: Effort normal and breath sounds normal.   Abdominal: Soft. He  exhibits no distension.   Musculoskeletal: Normal range of motion. He  exhibits no edema.   Neurological: He  is alert and oriented to person, place, and time.   Skin: Skin is warm and dry.     Lab Review:     Lab Results   Component Value Date    HGB 16.3 10/28/2014    HCT 48.0 10/28/2014    PLT 233 10/28/2014    WBC 9.0 10/28/2014    MCV 88.6 10/28/2014    MCH 30.0 10/28/2014     Lab Results   Component Value Date    NA 140 10/28/2014    K 3.5 10/28/2014    CL 105 10/28/2014    CO2 23 10/28/2014    BUN 11 10/28/2014    CREATININE 1.06 10/28/2014    GLUCOSE 247* 10/28/2014    CALCIUM 9.1 10/28/2014     No results found for: AST, ALT, ALBUMIN, ALKPHOS  No results found for: CHOLTOT, HDL, LDL, TRIG  No results found for: TSH  Lab Results   Component Value Date    HGBA1C 7.6* 10/28/2014    GLUCOSE 247* 10/28/2014    CREATININE 1.06 10/28/2014       Prior Diagnostic Testing:    10/28/2014: Echo  Study Conclusions    - Left ventricle: The cavity size was normal. Wall thickness was  ????normal. Systolic function was normal. The estimated ejection  ????fraction was in the range of 50% to 55%. Wall motion was normal;  ????there were no regional wall motion abnormalities. Left ventricular  ????diastolic function parameters were normal.  - Right ventricle: Systolic function was mildly reduced by objective  ????interpretation. TAPSE: 1.7cm.Tricuspid annular systolic velocity:  ????11cm/s.  - Pulmonary arteries: Systolic pressure was within the normal range,  ????in the range of 25mm Hg to 30mm Hg  assuming that the right atrial  ????  pressure was 0-5 mmHg.     Assessment:     Problem list:  1. CAD s/p left heart cath with diffuse calcified plaque in left main coronary artery as well as prox to mid LAD  2. HTN  3. History of PTCA  4. DM type II  5. Family Hx of CAD  6. Chronic EtOH use    Plan:       1. His cardiac cath had showed moderate CAD in LAD   2. Follow up stress test did not show any ischemia in that territory  3. Continue medical therapy with ASA, statin, BB  4. Consider adding low dose ACE in future  5. He is active, has no sx, has lost 12 lbs. His A1C was 7.6 - encouraged him to see his PCP to consider medical therapy for DM.   6. F/u echo as well    Follow up in 6 months.  Instructed patient to call office for worsening shortness of breath, chest pain, syncope, or any other concerning changes.     It is a pleasure to assist in the care of [facility]  Please call with any questions. All questions and concerns were addressed to the patient/family. Alternatives to my treatment were discussed. The note was completed using EMR. Every effort was made to ensure accuracy; however, inadvertent computerize transcription errors may be present.     This note was scribed in the presence of Collier Flowers, MD by [facility]    The scribe???s documentation has been prepared under my direction and personally reviewed by me in its entirety. I confirm that the note above accurately reflects all work, treatment, procedures, and medical decision making performed by me.    Collier Flowers MD  [facility]  Interventional Cardiology

## 2015-02-27 NOTE — Unmapped (Signed)
Decrease Aspirin to 81 mg by mouth daily    Schedule Echo    Labs printed to take to Primary Care Physician with referral for Diabetes management    Schedule follow up appt in 6 months

## 2015-03-03 MED ORDER — metoprolol tartrate (LOPRESSOR) 50 MG tablet
50 | ORAL_TABLET | ORAL | Status: AC
Start: 2015-03-03 — End: 2015-07-09

## 2015-03-03 MED ORDER — atorvastatin (LIPITOR) 20 MG tablet
20 | ORAL_TABLET | ORAL | Status: AC
Start: 2015-03-03 — End: 2015-07-09

## 2015-03-10 ENCOUNTER — Inpatient Hospital Stay: Payer: PRIVATE HEALTH INSURANCE

## 2015-05-13 MED ORDER — isosorbide mononitrate (IMDUR) 30 MG 24 hr tablet
30 | ORAL_TABLET | Freq: Every day | ORAL | Status: AC
Start: 2015-05-13 — End: 2016-03-04

## 2015-07-09 MED ORDER — atorvastatin (LIPITOR) 20 MG tablet
20 | ORAL_TABLET | ORAL | Status: AC
Start: 2015-07-09 — End: 2015-12-07

## 2015-07-09 MED ORDER — metoprolol tartrate (LOPRESSOR) 50 MG tablet
50 | ORAL_TABLET | ORAL | Status: AC
Start: 2015-07-09 — End: 2015-12-07

## 2015-09-08 ENCOUNTER — Encounter

## 2015-12-08 MED ORDER — atorvastatin (LIPITOR) 20 MG tablet
20 | ORAL_TABLET | ORAL | Status: AC
Start: 2015-12-08 — End: 2016-04-25

## 2015-12-08 MED ORDER — metoprolol tartrate (LOPRESSOR) 50 MG tablet
50 | ORAL_TABLET | ORAL | Status: AC
Start: 2015-12-08 — End: ?

## 2015-12-18 ENCOUNTER — Ambulatory Visit: Admit: 2015-12-18 | Discharge: 2015-12-18 | Payer: PRIVATE HEALTH INSURANCE

## 2015-12-18 DIAGNOSIS — I2510 Atherosclerotic heart disease of native coronary artery without angina pectoris: Secondary | ICD-10-CM

## 2015-12-18 MED ORDER — pantoprazole (PROTONIX) 40 MG tablet
40 | ORAL_TABLET | Freq: Every morning | ORAL | Status: AC
Start: 2015-12-18 — End: 2016-02-04

## 2015-12-18 NOTE — Unmapped (Signed)
Subjective:      Patient ID: Kirk Hernandez is a 70 y.o. male     Chief Complaint:  Follow-Up CAD    History of Present Illness:    Kirk Hernandez is a 70 y.o. White or Caucasian male presenting to the clinic today for an abnml stress test. He has a hx of tinnitus and tried to get a hearing aid but was unable to. 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 Over 3.5 years he was told he no longer was considered diabetic. He denies kidney problems. He had a balloon angioplasty in 1999 in [place], [place] at the [facility]. He had an asymptomatic MI, other than pain on the right side of his face. He had similar sxs 2 weeks ago and thought it was an abscess tooth. He has an rx for nitro pills but has not had to use them. He denies taking ASA or cholesterol medicine. He has a hx of CAD. He has a family hx of heart dz. NKDA. He quit smoking in 1999. He drinks 10-15 beers qd. He has not exercised regularly in 2 years. He used to walk 5 miles every morning. When taking the stress test, he c/o SOB and leg heaviness.  He denies problems sleeping and lives by himself. He denies liver problems or blood clotting issues. Today, pt denies chest pain, palpitations, lightheadedness/dizziness, leg edema, or presyncope/syncope. Pt has no other complaints at this time.    Interval History: 11/14/14    Kirk Hernandez is a 70 y.o. White or Caucasian male presenting to the clinic today for a follow up after his cath procedure. He was overall happy with his procedure and service. He c/o fatigue in the afternoon but states that he is an early riser. He notes some chest pain. He is very active and does some walking. Denies any problems walking. He joined the rec center at [employer].     Denies palpitations, lightheadedness/dizziness, shortness of breath, leg edema, or presyncope/syncope.    No other concerns or complaints at this time.     The following portions of the patient's history were reviewed and updated as  appropriate: allergies, current medications, past family history, past medical history, past social history, past surgical history and problem list.      02/27/2015 Interval visit   Kirk Hernandez is a 70 y.o. White or Caucasian male. Since his last visit he has been doing well. He lost 12 lbs with good diet and exercise regimen.       12/18/2015 Initial visit   Kirk Hernandez is a 70 y.o. White or Caucasian male with a hx of CAD, HTN, DM, and Hx of PTCA.    - Pt is feeling well and is excited towards his retirement in 6 months.  - He has severe heartburn after eating a meal, causing him to lay down. He is unsure if it is due to how quickly he eats or if he eats meat.  - He does not have vomiting and denies symptoms when he is not eating. When he is physically active, he does not have symptoms.    - Not issues with walking several blocks, but has noticed a reduction in his conditioning. He likes taking naps.  - He drinks at most 3 beers per day, but he does drink everyday    Patient denies chest pain, palpitations, lightheadedness/dizziness, shortness of breath, leg edema, or presyncope/syncope.    Histories:    He has no  past medical history on file.  He  has no past surgical history on file.  He family history is not on file.  He  reports that he has never smoked. He does not have any smokeless tobacco history on file. He reports that he drinks about 7.2 oz of alcohol per week.    ROS:      Reviewed and scanned into EMR/EPIC.     Allergies:      He has No Known Allergies.    Medications:      Outpatient Prescriptions Prior to Visit   Medication Sig Dispense Refill   ??? aspirin 81 MG chewable tablet Chew 1 tablet (81 mg total) by mouth daily. 30 tablet 0   ??? atorvastatin (LIPITOR) 20 MG tablet TAKE 1 TABLET BY MOUTH EVERY DAY 30 tablet 3   ??? atorvastatin (LIPITOR) 40 MG tablet Take 1 tablet (40 mg total) by mouth daily. 30 tablet 3   ??? isosorbide mononitrate (IMDUR) 30 MG 24 hr tablet Take 1 tablet (30 mg total) by  mouth daily. 90 tablet 2   ??? metoprolol tartrate (LOPRESSOR) 50 MG tablet TAKE 1 TABLET BY MOUTH 2 TIMES A DAY. 60 tablet 3     No facility-administered medications prior to visit.       Objective:      BP 110/64 mmHg   Pulse 85   Wt 235 lb (106.595 kg)   SpO2 97%  Wt Readings from Last 3 Encounters:   02/27/15 231 lb 8 oz (105.008 kg)   11/14/14 236 lb (107.049 kg)   10/17/14 235 lb (106.595 kg)          Physical Exam   Constitutional: No distress. He appears healthy.   HENT:  ?? Nose: Nose normal.   ?? Mouth/Throat: Dentition is normal.   ?? Eyes: Conjunctivae are normal. Pupils are equal, round, and reactive to light.   Neck: Normal range of motion. Neck supple. No JVD present.   Cardiovascular: Normal rate, regular rhythm and normal pulses.  Exam reveals no gallop and no friction rub.  No murmurs.  No carotid bruit  Pulmonary/Chest: Effort normal and breath sounds normal.   Abdominal: Soft. He  exhibits no distension.   Musculoskeletal: Normal range of motion. He  exhibits no edema.   Neurological: He  is alert and oriented to person, place, and time.   Skin: Skin is warm and dry.     Lab Review:     Lab Results   Component Value Date    HGB 16.3 10/28/2014    HCT 48.0 10/28/2014    PLT 233 10/28/2014    WBC 9.0 10/28/2014    MCV 88.6 10/28/2014    MCH 30.0 10/28/2014     Lab Results   Component Value Date    NA 140 10/28/2014    K 3.5 10/28/2014    CL 105 10/28/2014    CO2 23 10/28/2014    BUN 11 10/28/2014    CREATININE 1.06 10/28/2014    GLUCOSE 247* 10/28/2014    CALCIUM 9.1 10/28/2014     No results found for: AST, ALT, ALBUMIN, ALKPHOS  No results found for: CHOLTOT, HDL, LDL, TRIG  No results found for: TSH  Lab Results   Component Value Date    HGBA1C 7.6* 10/28/2014    GLUCOSE 247* 10/28/2014    CREATININE 1.06 10/28/2014       Prior Diagnostic Testing:    Last Echocardiogram:  10/28/14    Study Conclusions    -  Left ventricle: The cavity size was normal. Wall thickness was  ????normal. Systolic function was  normal. The estimated ejection  ????fraction was in the range of 50% to 55%. Wall motion was normal;  ????there were no regional wall motion abnormalities. Left ventricular  ????diastolic function parameters were normal.  - Right ventricle: Systolic function was mildly reduced by objective  ????interpretation. TAPSE: 1.7cm.Tricuspid annular systolic velocity:  ????11cm/s.  - Pulmonary arteries: Systolic pressure was within the normal range,  ????in the range of 25mm Hg to 30mm Hg assuming that the right atrial  ????pressure was 0-5 mmHg.    Last Stress Test:  11/14/14    FINAL INTERPRETATION  There is normal myocardial perfusion. Overall study quality is excellent. ??There is study artifact related to sub-diaphragmatic interfering activity. ??Scan significance indicates low cardiac risk. ??Test sensitivity is reduced with testing on antianginal drugs.    PERFUSION FINDINGS  There is normal left ventricular perfusion with no visual evidence of transient dilation and a normal stress/rest left ventricular volume ratio of 0.92. ??The sum stress score is 0 (normal: less than 4).    The right ventricle is normal.    FUNCTION FINDINGS  The resting left ventricular ejection fraction is normal at 52% with an end diastolic volume of 106 mL and end systolic volume of 51 mL.     The post-stress left ventricular ejection fraction is normal at 56% with an end diastolic volume of 112 mL and end systolic volume of 49 mL. Left ventricular regional wall motion is normal.    PHARMACOLOGIC (REGADENOSON) TECHNIQUE  Indication: The patient is referred for the evaluation of chest pain.    The patient was not experiencing chest pain at the time of the study on 11/28/14. ??The pharmacologic technique was utilized due to the patient's difficulty walking. ??The patient received an intravenous injection of 12.6 millicuries of Tc-63m tetrofosmin with the patient at rest (heart rate 83, blood pressure 153/97). ??Gated cardiac SPECT imaging was performed one hour  following injection at 0815. ??    Subsequently the patient received 0.4 milligrams of regadenoson intravenously followed by an intravenous injection of 40 millicuries of Tc-46m tetrofosmin (stress heart rate 94, blood pressure 156/94). ??Gated cardiac SPECT imaging was performed 45 minutes following injection at 0935. ??The patient experienced no chest pain during the test.     ECG DATA:    The stress ECG is reported separately.    Participating fellow: Orlie Dakin, MD    Please refer to the FINAL SCAN INTERPRETATION at the top of this report.    Last Heart Catheterization/PCI:  10/28/14    PROCEDURES PERFORMED:  1. Left heart catheterization  2. Selective coronary angiography  3. Left ventriculogram  4. Deployment of angioseal     INDICATIONS:   Positive stress test  ??  DETAILS OF PROCEDURE: ??  Procedure, it risks, benefits and alternatives were explained to   the patient, all questions were answered and informed consent was   obtained.    * The patient was brought to [facility] and prepped and draped in the usual   sterile fashion for intended access to the ??right femoral artery.    ??* Lidocaine 2% was injected subcutaneously to achieve local   anesthesia. ??* The ??right was accessed via modified Seldinger   technique without difficulty, and a 5 [place] ??arterial sheath was   advanced over a wire into the artery, the wire removed, ??and the   sheath aspirated  and flushed. ??    * The sideport of the arterial sheath was hooked directly to the   manifold for injection of the right femoral artery. ??This showed   the access site to be above the level of the femoral arterial   bifurcation with no evidence of vascular complication. ??  * A JL 4 catheter was advanced over a wire, visualized under   fluoroscopy, and used to selectively engage the left coronary   ostia. ??Images of the left coronary tree were obtained. ??    * The JL4 catheter was then removed and exchanged over a  wire for   a JR 4 catheter. This was used to selectively engage the right   coronary ostia. ??Images of the ??right coronary tree were   obtained, and the JR 4 catheter was then removed in its entirety.   ??The sheath was again aspirated and flushed without difficulty. ??    * A pigtail catheter was advanced over a wire, visualized under   fluoroscopy, and used to cross the aortic valve and enter the   left ventricular cavity. ??    * Left ventricular end diastolic pressure was measured. ??    * A left ventriculogram was performed with a power injector.     * The pigtail catheter was pulled back across the aortic valve,   demonstrating no gradient. ??The pigtail catheter was then removed   in its entirety. ??    * The patient was stable throughout the procedure and at the end   of procedure, no complication noted. ??    ??PROCEDURAL FINDINGS:  1. Left main coronary artery was heavily calcified and diffusely   diseased up to 40%. It gave of the left anterior descending   artery, ramus intermidius and the left circumflex artery.   ??  2. The left anterior descending had a normal anatomic course. ??It   gave off diagonals. Proximal to mid LAD was diffusely diseased   and heavily calcified, long sequential 60-70% stenosis noted in   proximal LAD. First D1 branch was medium size vessel and mid D1   had 60% stenosis.     3. The left circumflex artery was small, non-dominant vessel and   it gives off obtuse marginals and it was diffusely diseased.     4. RI was diffusely diseased with stenosis up to 40%.     5. The right coronary artery was a large, dominant vessel and   gave rise to posterior descending artery and posterolateral   branches distally. RCA wad diffusely diseased up to 40% and and   heavily calcified.    HEMODYNAMICS:  LVEDP:  LVEF: 50-55%  No gradient across the aortic valve ??    CONCLUSION:  1. Moderate Diffuse calcified CAD in LAD/ diagonal     PLAN:  1. Add high intensity statin, continue ASA for secondary    prevention.   2. Echocardiogram prior to discharge.  3. Follow up with PCP ASAP for better DM management.   4. Follow up with Dr. Richardine Service as outpatient in next 2-3 weeks, will   reassess his angina after medication optimization. May consider a   stress MPI to assess medical therapy is working or he may need   CABG.    Last Cardiac Event/Holter Monitor:  N/A     Assessment:     Problem list:    1. CAD s/p left heart cath with diffuse calcified plaque in left main coronary artery as well  as prox to mid LAD  2. HTN  3. History of PTCA  4. DM type II  5. Family Hx of CAD  6. Chronic EtOH use    Plan:     1. He does not have any symptoms with exertion, however he does describe heartburn symptoms after eating certain types of foods. I encourage him to discuss his symptoms with his PCP to r/o significant esophageal issues or GERD.  2. Will prescribe his Protonix 40 mg daily for 1 month  3. Encouraged to take metoprolol BID, Home BP log.  4. If symptoms continue after treating GERD, then may investigate CAD    Follow up in 4 months.      Instructed patient to call office for worsening shortness of breath, chest pain, syncope, or any other concerning changes.     It is a pleasure to assist in the care of [facility].  Please call with any questions. All questions and concerns were addressed to the patient/family. Alternatives to my treatment were discussed. The note was completed using EMR. Every effort was made to ensure accuracy; however, inadvertent computerize transcription errors may be present.     This note was scribed in the presence of Collier Flowers, MD by Dub Mikes    The scribe???s documentation has been prepared under my direction and personally reviewed by me in its entirety. I confirm that the note above accurately reflects all work, treatment, procedures, and medical decision making performed by me.    Collier Flowers MD  [facility]  Interventional Cardiology

## 2015-12-18 NOTE — Unmapped (Signed)
Take Protonix 40 once daily for one month.    Call [phone] to schedule an appointment with GI.    Follow up with Dr. Richardine Service in four months. [phone]

## 2016-02-04 MED ORDER — pantoprazole (PROTONIX) 40 MG tablet
40 | ORAL_TABLET | ORAL | Status: AC
Start: 2016-02-04 — End: ?

## 2016-03-04 MED ORDER — isosorbide mononitrate (IMDUR) 30 MG 24 hr tablet
30 | ORAL_TABLET | ORAL | Status: AC
Start: 2016-03-04 — End: ?

## 2016-04-26 MED ORDER — atorvastatin (LIPITOR) 20 MG tablet
20 | ORAL_TABLET | ORAL | Status: AC
Start: 2016-04-26 — End: ?

## 2017-07-25 IMAGING — CT CT ABDOMEN PELVIS W/ UROGRAM
2 of 6 series · 13 of 46 positions shown, 15 images · IV contrast (APPLIED)
Comparison: None 
I stat estimated eGFR 78.5, creatinine

******** ADDENDUM #1 ********/n 
Addendum: Although somewhat atypical, the findings in the lower pole could 
possibly be related to renal infarct. Both the upper pole and lower pole are 
somewhat geographic appearing although the upper pole lesion is somewhat 
exophytic. 
------------------------------------------------------------------------------ 
-- 
------------------------ 
This paragraph in the body of the report should read 
There is an infiltrative complex cystic LEFT lower pole mass which also has 
cyst 
peripherally and solid component centrally, displacing the collecting system, 
measuring 4.4 x 3.9 cm based on the lower perfusion centrally on the delayed 
phase images. This is a subtle central component on the earlier arterial phase 
images. 
ORIGINAL REPORT ******** 
CT ABDOMEN PELVIS W/ UROGRAM, 07/25/2017 [DATE]: 
CLINICAL INDICATION: 71-year-old male with asymptomatic hematuria history of 
hernia repair, remote history of smoking 
A search for DICOM formatted images was conducted for prior CT imaging studies 
completed at a non-affiliated media free facility.
TECHNIQUE: The region of interest was scanned with and without contrast on a 
high resolution low dose CT scanner. 100 cc's of Isovue 300 was injected 
intravenously.  Routine MPR and MIP 3D renderings were reconstructed on an 
independent workstation with concurrent physician supervision.

[Series 5: abd/pel ax w · axial · 0.84mm/px · z∈[-553,-112]mm · 10 of 175 slices shown, 12 images]
[im 14/175  soft-tissue]
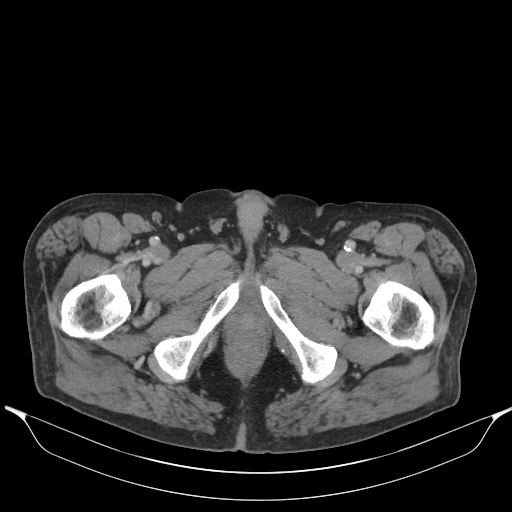
[im 14/175  bone]
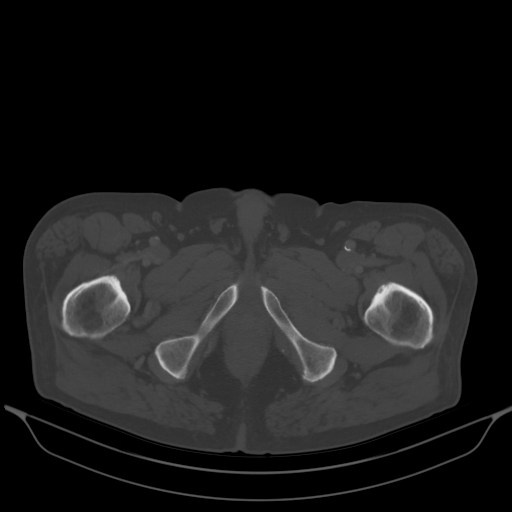
[im 27/175  soft-tissue]
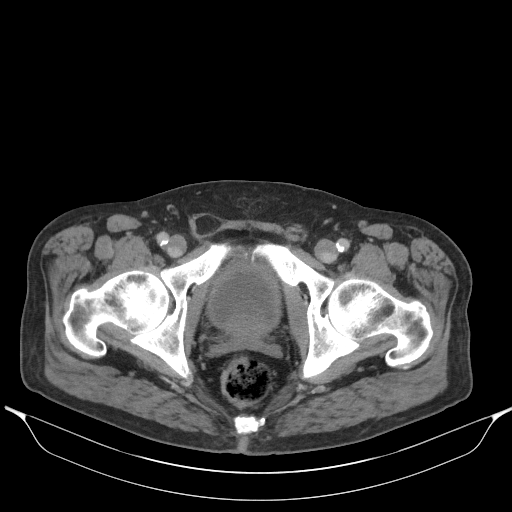
[im 54/175  soft-tissue]
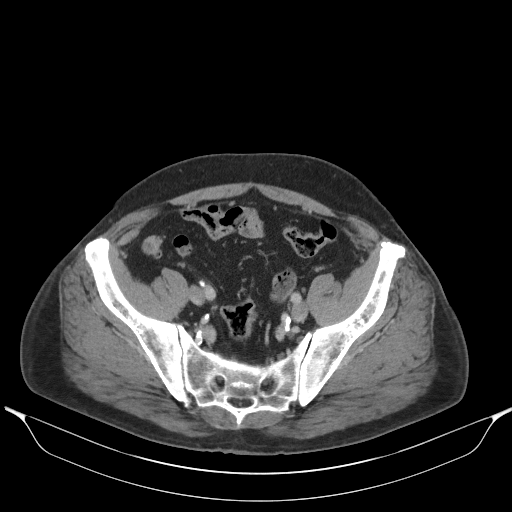
[im 67/175  soft-tissue]
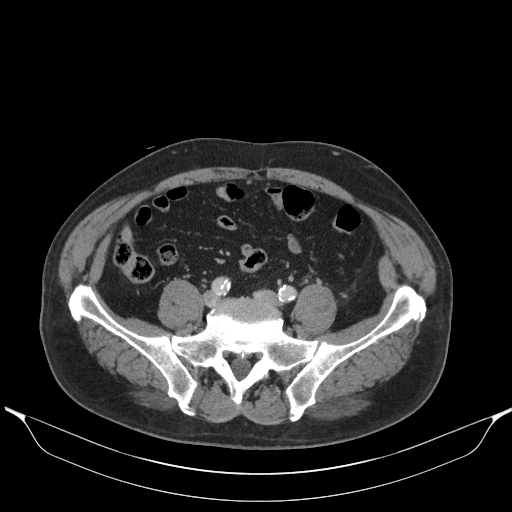
[im 81/175  soft-tissue]
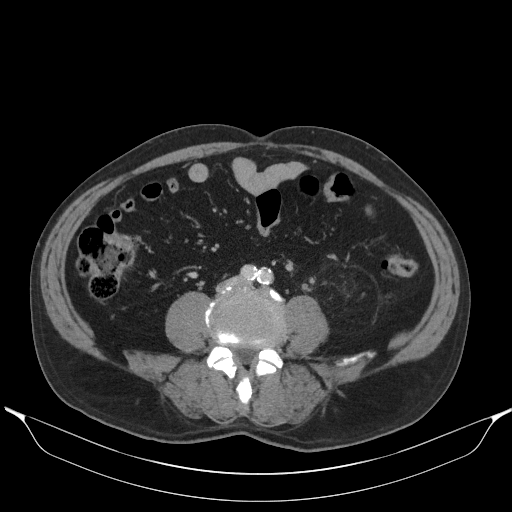
[im 94/175  soft-tissue]
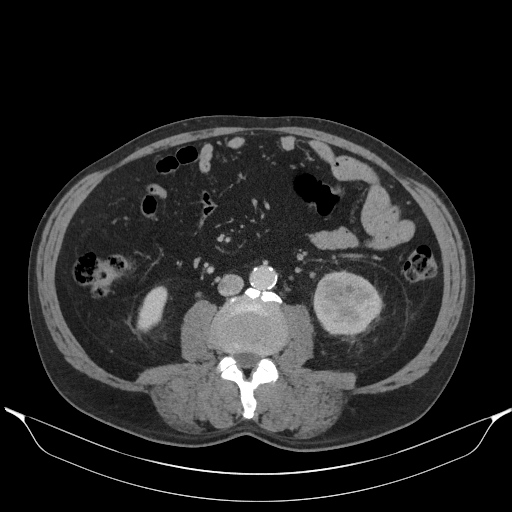
[im 108/175  soft-tissue]
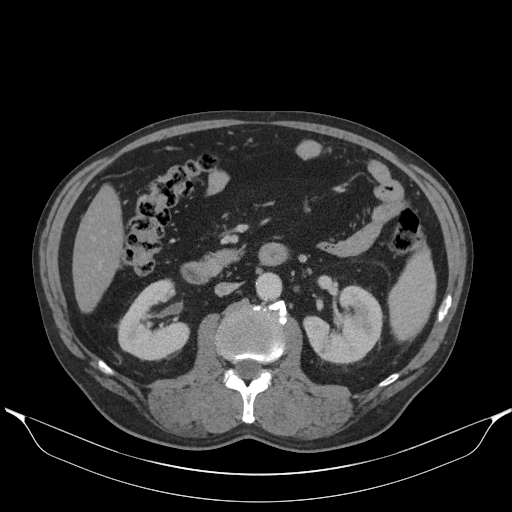
[im 134/175  soft-tissue]
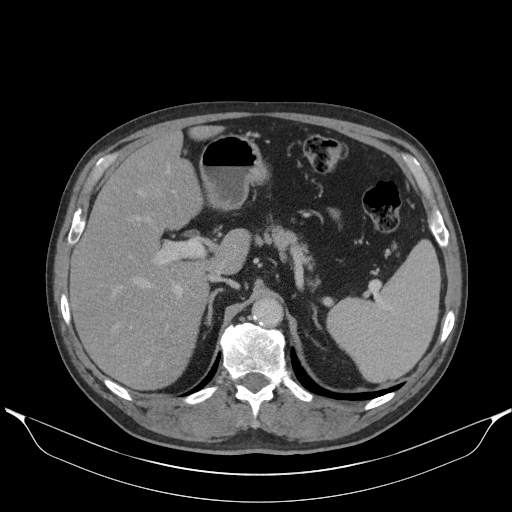
[im 148/175  soft-tissue]
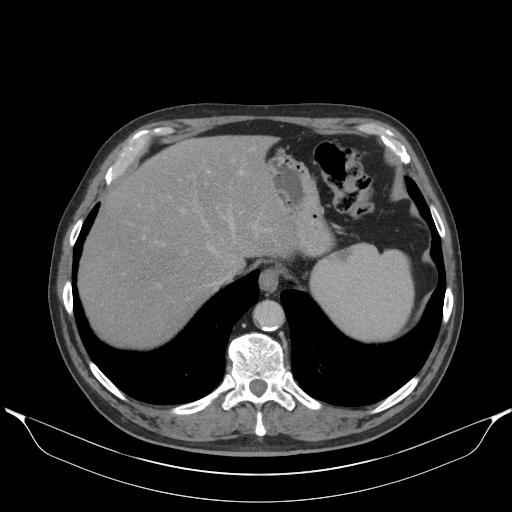
[im 148/175  bone]
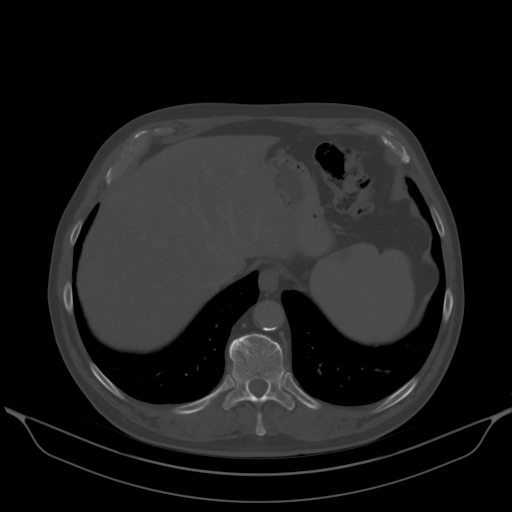
[im 161/175  soft-tissue]
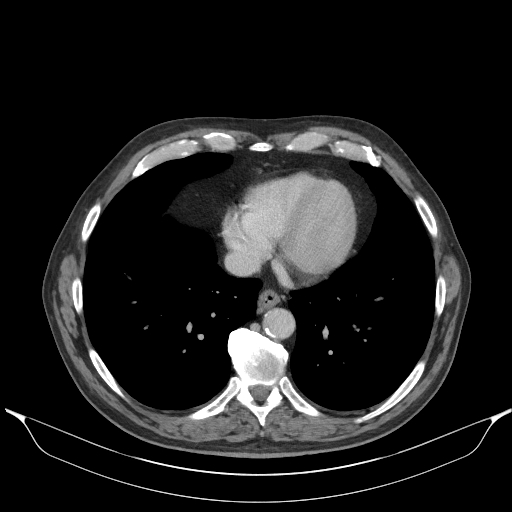

[Series 6: abd/pel cor w · coronal · 0.90mm/px · 3 of 149 slices shown]
[im 38/149  soft-tissue]
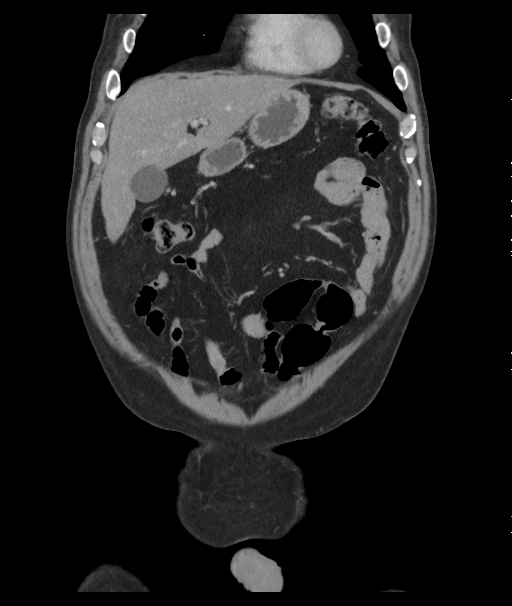
[im 75/149  soft-tissue]
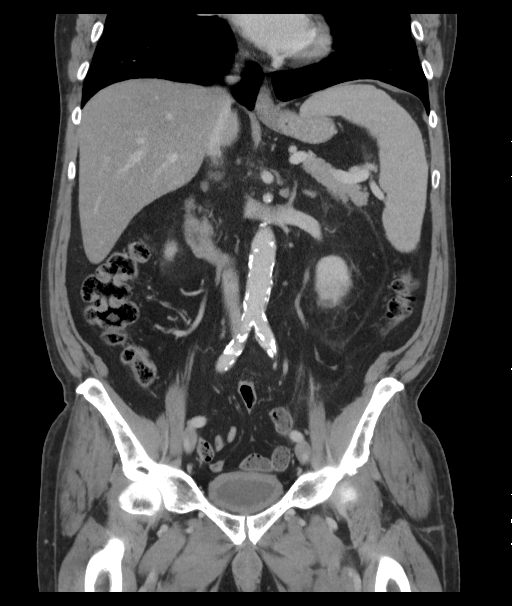
[im 112/149  soft-tissue]
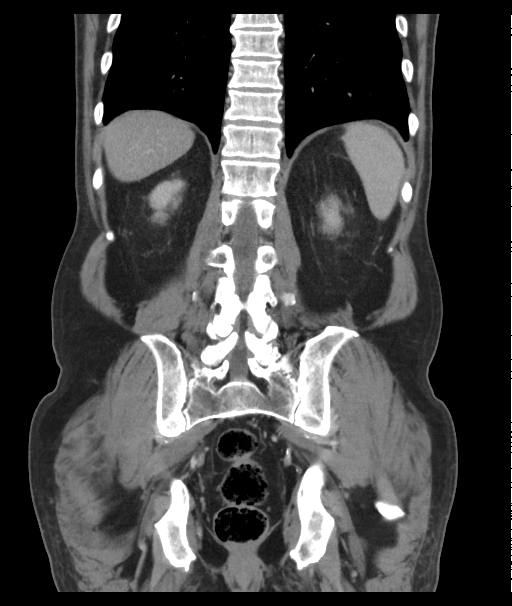

[13 of 46 positions shown; findings below may reference images not displayed]

FINDINGS: Clear lung bases. 
Small hiatal hernia. 
No GU stones identified. 
There is a 1.2 cm right upper pole parapelvic cystic lesion which appears to 
have some internal heterogeneity on the sagittal reconstructed series. 
There is a cortical exophytic complex cystic lesion anterior left upper pole 
with central solid component, measuring 3.7 x 3.2 cm. There appears to be the 
central portion as an infiltrative solid mass displacing the collecting 
system. 
There is an infiltrative complex cystic right lower pole mass which also has 
cyst peripherally and solid component centrally, displacing the collecting 
system, measuring 4.4 x 3.9 cm based on the lower perfusion centrally on the 
delayed phase images. This is a subtle central component on the earlier 
arterial 
phase images. 
There is nonspecific perinephric stranding. 
There are multiple prominent retroperitoneal lymph nodes including a 1 cm 
short 
axis left perinephric lymph node, a 0.7 cm interaortocaval lymph node, and 7 
mm 
left para-aortic lymph node. There are 7 and 6 mm lymph nodes adjacent to the 
left renal hilum. A true arterial phase exam was not performed. There appears 
to 
be a single left renal artery. The coronal reconstructed images demonstrate 
multiple small retroperitoneal lymph nodes including interaortocaval nodes. 
The 
largest lymph node is left para-aortic and measures 2.6 cm in length by 1.3 cm 
short axis. 
There is no iliac or obturator adenopathy. There are small bilateral inguinal 
lymph nodes. 
There are nonspecific nodules identified, one at the cardiac apex in the 
epicardial fat measuring 1.0 x 0.6 cm.  There is a right pericardiophrenic 
angle 
lymph node measuring 6 mm short axis. 
Urinary bladder is moderately distended and mildly thick-walled especially 
over 
the dome and anteriorly. Prostate is mildly enlarged measuring approximately 
4.6 
cm transversely by 5 cm on the sagittal images. 
Excretory phase images demonstrate no delay in contrast excretion. Normal 
filling of the urinary bladder without focal bladder base lesion. The anterior 
upper pole lesion may arise from the anterior calyceal apex. The lower pole 
lesion origin is not clear. 
The right upper pole parapelvic lesion density is indeterminate on delayed 
phase. 
The liver is diffusely hypodense compatible with fatty infiltration. 
Spleen is enlarged. 
Normal enhancement and appearance of the pancreas, and bilateral adrenal 
glands. 
Gallbladder is moderately distended. There is a calcific density in the region 
of the common duct which may be either a small common duct stone or 
calcification right outside the common duct or the cystic duct junction, axial 
series 5 image 43 
Aorta is atherosclerotic but not aneurysmal. 
Bowel pattern is nonobstructive. 
Normal air-filled appendix in the right lower quadrant. There is right 
inguinal 
hernia containing fat. 
Stomach is decompressed. Small bowel pattern is nonobstructive. 
Bones are osteopenic. There are bridging osteophytes of the lower thoracic and 
lumbar vertebral bodies. No focal lytic or blastic lesions identified. Right 
hip 
osteoarthritis.
IMPRESSION: Left upper pole 3.7 x 3.2 cm and lower pole 4.4 x 3.9 cm complex cystic and 
solid infiltrative appearing masses suspicious for renal lymphoma, multi focal 
cystic/necrotic renal cell or transitional cell carcinoma. There are multiple 
small but suspicious retroperitoneal lymph nodes which cross the midline. 
1.2 cm right upper pole parapelvic possible cyst versus cystic lesion. 
Consider 
MRI to help further stage. 
Splenomegaly. 
Single left renal artery. 
Epicardial fat nodule and a right cardiophrenic angle lymph node, both of 
which 
are small but will need to be followed. 
Thickened urinary bladder wall without focal lesion. Prostate is mildly 
enlarged. 
Fatty liver. 
Findings called to referring clinician first available 07/26/17 as report was 
completed 07/25/17 at 2369 hours EST. 
RADIATION DOSE REDUCTION: All CT scans are performed using radiation dose 
reduction techniques, when applicable.  Technical factors are evaluated and 
adjusted to ensure appropriate moderation of exposure.  Automated dose 
management technology is applied to adjust the radiation doses to minimize 
exposure while achieving diagnostic quality images.

## 2022-06-08 IMAGING — MR MRI CERVICAL SPINE W/WO CONTRAST
1 series · 35 of 35 positions shown · IV contrast (Gadolinium)
Comparison: No prior cervical MRI

________________________________________________________________________________________________ 
MRI CERVICAL SPINE W/WO CONTRAST, 06/08/2022 [DATE]: 
CLINICAL INDICATION: Right-sided numbness and arm tremor, gait ataxia
TECHNIQUE: Sagittal T1, Sagittal T2, Sagittal STIR, Axial TSE, Axial HBTTL, 
Enhanced Sagittal T1 with fat suppression, and Enhanced Axial T1 MR images of 
the cervical spine were performed with and without intravenous enhancement.
mL of Gadavist were injected intravenously. 1.5 mL of Gadavist was discarded.  
Patient was scanned on a 1.5T magnet.

[Series 1401: (person_name)1_(person_name)_(person_name) · axial · 3.0mm · 0.59mm/px · z∈[-200,-100]mm · 35 of 35 slices shown]
[im 1/35]
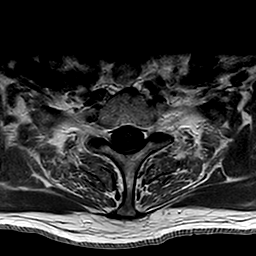
[im 2/35]
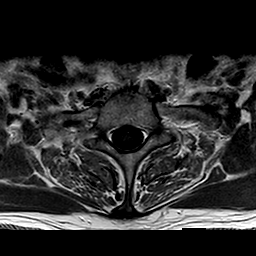
[im 3/35]
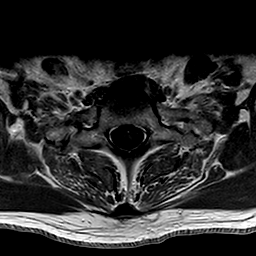
[im 4/35]
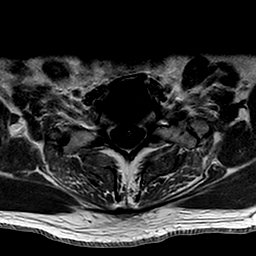
[im 5/35]
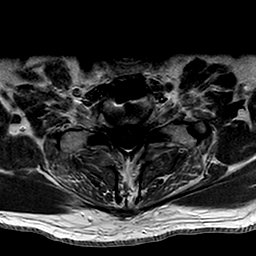
[im 6/35]
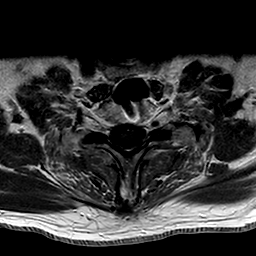
[im 7/35]
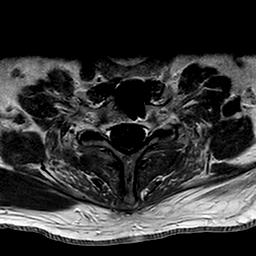
[im 8/35]
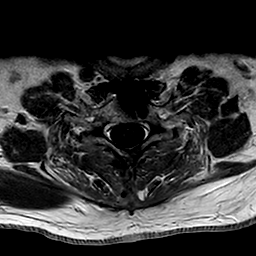
[im 9/35]
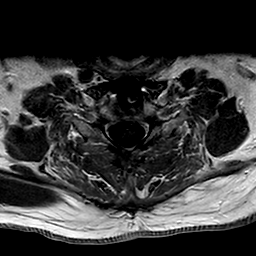
[im 10/35]
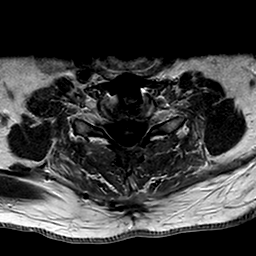
[im 11/35]
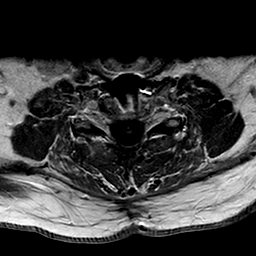
[im 12/35]
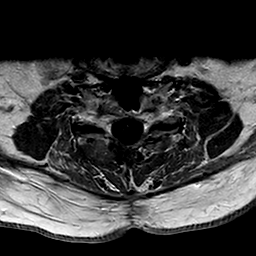
[im 13/35]
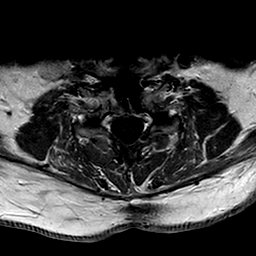
[im 14/35]
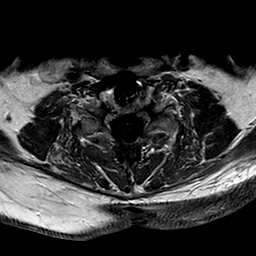
[im 15/35]
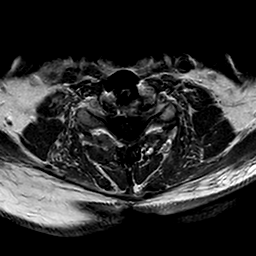
[im 16/35]
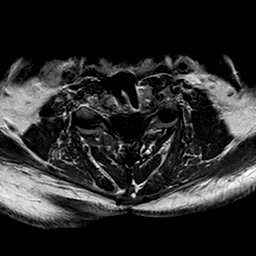
[im 17/35]
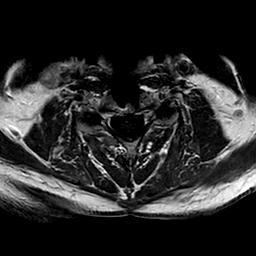
[im 18/35]
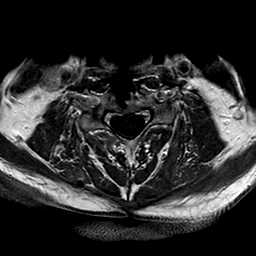
[im 19/35]
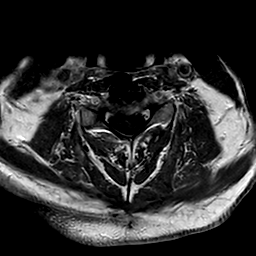
[im 20/35]
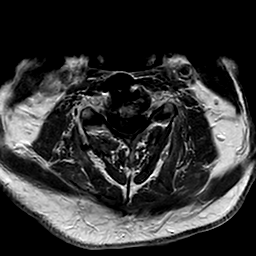
[im 21/35]
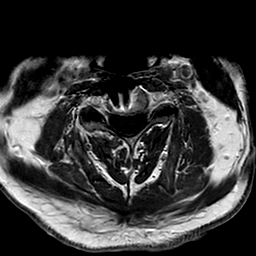
[im 22/35]
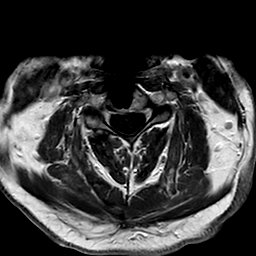
[im 23/35]
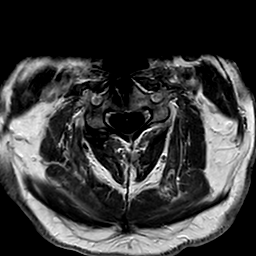
[im 24/35]
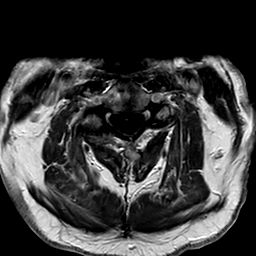
[im 25/35]
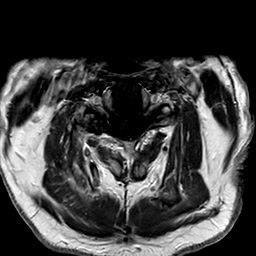
[im 26/35]
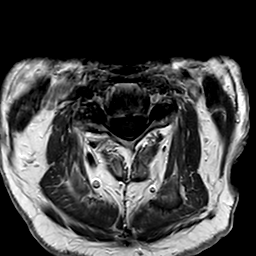
[im 27/35]
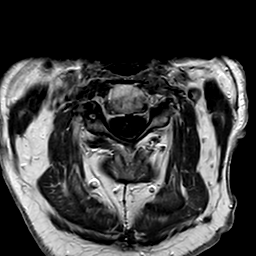
[im 28/35]
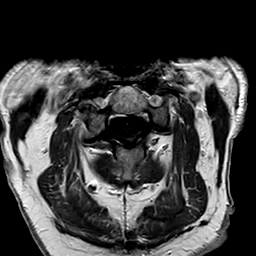
[im 29/35]
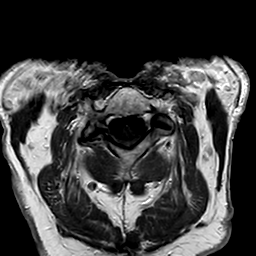
[im 30/35]
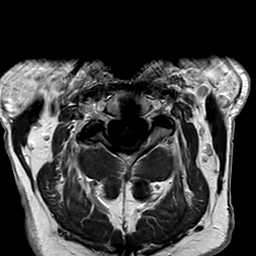
[im 31/35]
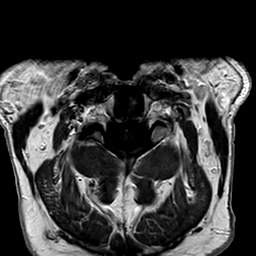
[im 32/35]
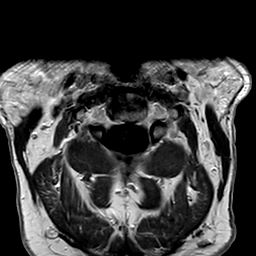
[im 33/35]
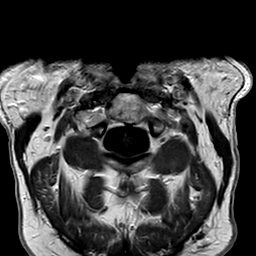
[im 34/35]
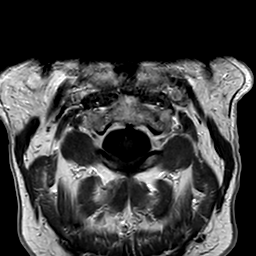
[im 35/35]
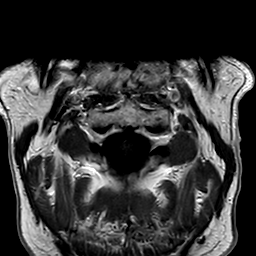

[35 of 35 positions shown; findings below may reference images not displayed]

FINDINGS: There is increased T2 signal within the cord opposite C6-7, extending 
5 mm within the central cord, relative sparing of the periphery. There is mild 
thinning of the cord at this level seen on sagittal image 8. This likely 
represents chronic myelomalacic change. No other cord signal abnormality. 
Patient has undergone C4-C7 ACDF with anterior plate and screw fusion. There has 
been posterior decompression opposite C5-6 and C6-7. 
Currently there is mild disc bulge at C3-4 and approximating the cord without 
deformity, canal diameter 8 mm. At C4-5 there is posterior osteophyte touching 
the cord without deformity, canal diameter 7 mm. At C5-6 the canal is open, 
diameter 11 mm. At C6-7 diameter is 12 mm, at C7-T1 10.5 mm. At C2-3 Canal 
diameter is 9 mm. 
Axial images show marked right, moderate left foraminal stenosis at C3-4. There 
is marked left foraminal stenosis at C5-6. Other foramina are open. 
The dens is intact. Craniocervical junction is open. 
Enhanced images show minimal enhancement in the central cord opposite C6-7, 
approximately 1.4 mm on axial enhanced image 10, fat suppressed enhanced image 
8. This is nonspecific and could represent mild benign hyperemia. 
Recent/resolving myelitis cannot be absolutely excluded. Correlation with 
clinical history necessary.
IMPRESSION: There is increased T2 signal in the central cord opposite C6-7 with mild cord 
thinning. This likely represents chronic myelomalacic change. However, enhanced 
images show minimal enhancement in the central cord at this level, approximately 
1.4 mm. This enhancement is nonspecific and could represent mild incidental 
hyperemia. Recent/resolving myelitis cannot be absolutely excluded. Correlation 
with clinical history necessary. If preoperative cervical MRI examinations are 
available comparison would be useful. 
There has been C4-C7 fusion, including decompression opposite C5-6 and C6-7. At 
the surgical levels there is no significant residual stenosis or evidence for 
extrinsic cord deformity. 
There is mild canal stenosis in the upper cervical spine, in part due to short 
posterior elements. There is marked right, moderate left foraminal stenosis at 
C3-4 encroaching on the exiting C4 nerve roots. 
No evidence for fracture or malignancy.
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
